# Patient Record
Sex: Male | Born: 1995
Health system: Southern US, Community
[De-identification: ages and names within clinical notes are randomized; demographics above are authoritative.]

## PROBLEM LIST (undated history)

## (undated) DIAGNOSIS — L309 Dermatitis, unspecified: Secondary | ICD-10-CM

## (undated) DIAGNOSIS — Z87828 Personal history of other (healed) physical injury and trauma: Secondary | ICD-10-CM

## (undated) DIAGNOSIS — H9193 Unspecified hearing loss, bilateral: Secondary | ICD-10-CM

## (undated) HISTORY — DX: Dermatitis, unspecified: L30.9

## (undated) HISTORY — PX: EXTERNAL EAR SURGERY: SHX627

## (undated) HISTORY — PX: TESTICLE SURGERY: SHX794

## (undated) HISTORY — DX: Personal history of other (healed) physical injury and trauma: Z87.828

## (undated) HISTORY — DX: Unspecified hearing loss, bilateral: H91.93

---
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
## 2015-03-25 ENCOUNTER — Telehealth: Payer: Self-pay | Admitting: General Practice

## 2015-03-25 NOTE — Telephone Encounter (Signed)
Yes,  please schedule at the pt  convenience

## 2015-03-25 NOTE — Telephone Encounter (Signed)
Relation to JY:NWGN Call back number:[phone]   Reason for call:  Gary Dillon I patient of Dr. Drue Novel is referring his son to establish care. Do you approve please advise

## 2015-03-26 NOTE — Telephone Encounter (Signed)
Patient scheduled for 07/23/2015

## 2015-05-28 DIAGNOSIS — Z0271 Encounter for disability determination: Secondary | ICD-10-CM

## 2015-07-22 ENCOUNTER — Telehealth: Payer: Self-pay | Admitting: *Deleted

## 2015-07-22 ENCOUNTER — Encounter: Payer: Self-pay | Admitting: *Deleted

## 2015-07-22 NOTE — Telephone Encounter (Signed)
Unable to reach patient at time of pre-visit call. Left message for patient to return call when available.  

## 2015-07-22 NOTE — Telephone Encounter (Signed)
Pt returned your call.    CB: [phone]

## 2015-07-22 NOTE — Telephone Encounter (Signed)
Pre-Visit Call completed with patient and chart updated.   Pre-Visit Info documented in Specialty Comments under Snap[employer].    

## 2015-07-23 ENCOUNTER — Ambulatory Visit (INDEPENDENT_AMBULATORY_CARE_PROVIDER_SITE_OTHER): Payer: 59 | Admitting: Internal Medicine

## 2015-07-23 ENCOUNTER — Encounter: Payer: Self-pay | Admitting: Internal Medicine

## 2015-07-23 VITALS — BP 122/74 | HR 68 | Temp 97.4°F | Ht 76.0 in | Wt 166.2 lb

## 2015-07-23 DIAGNOSIS — Z0000 Encounter for general adult medical examination without abnormal findings: Secondary | ICD-10-CM
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
## 2015-07-23 NOTE — Assessment & Plan Note (Signed)
Up-to-date on immunizations including a recent flu shot.  Labs: CMP, total cholesterol, CBC, TSH Patient education: Diet, exercise, STE

## 2015-07-23 NOTE — Progress Notes (Signed)
Pre visit review using our clinic review tool, if applicable. No additional management support is needed unless otherwise documented below in the visit note. 

## 2015-07-23 NOTE — Patient Instructions (Signed)
BEFORE YOU LEAVE THE OFFICE:  GO TO THE LAB : Get the blood work    GO TO THE FRONT DESK Schedule a complete physical exam to be done in 1 year  Please be fasting       Testicular Self-Exam A self-examination of your testicles involves looking at and feeling your testicles for abnormal lumps or swelling. Several things can cause swelling, lumps, or pain in your testicles. Some of these causes are:  Injuries.  Inflammation.  Infection.  Accumulation of fluids around your testicle (hydrocele).  Twisted testicles (testicular torsion).  Testicular cancer. Self-examination of the testicles and groin areas may be advised if you are at risk for testicular cancer. Risks for testicular cancer include:  An undescended testicle (cryptorchidism).  A history of previous testicular cancer.  A family history of testicular cancer. The testicles are easiest to examine after warm baths or showers and are more difficult to examine when you are cold. This is because the muscles attached to the testicles retract and pull them up higher or into the abdomen. Follow these steps while you are standing:  Hold your penis away from your body.  Roll one testicle between your thumb and forefinger, feeling the entire testicle.  Roll the other testicle between your thumb and forefinger, feeling the entire testicle. Feel for lumps, swelling, or discomfort. A normal testicle is egg shaped and feels firm. It is smooth and not tender. The spermatic cord can be felt as a firm spaghetti-like cord at the back of your testicle. It is also important to examine the crease between the front of your leg and your abdomen. Feel for any bumps that are tender. These could be enlarged lymph nodes.    This information is not intended to replace advice given to you by your health care provider. Make sure you discuss any questions you have with your health care provider.   Document Released: 09/14/2000 Document Revised:  02/08/2013 Document Reviewed: 11/28/2012 Elsevier Interactive Patient Education [employer]

## 2015-07-24 LAB — CBC WITH DIFFERENTIAL/PLATELET
BASOS ABS: 0 10*3/uL (ref 0.0–0.1)
Basophils Relative: 0.8 % (ref 0.0–3.0)
EOS ABS: 0.5 10*3/uL (ref 0.0–0.7)
Eosinophils Relative: 8.4 % — ABNORMAL HIGH (ref 0.0–5.0)
HEMATOCRIT: 43.3 % (ref 36.0–49.0)
Hemoglobin: 14.2 g/dL (ref 12.0–16.0)
LYMPHS PCT: 33.7 % (ref 24.0–48.0)
Lymphs Abs: 1.9 10*3/uL (ref 0.7–4.0)
MCHC: 32.7 g/dL (ref 31.0–37.0)
MCV: 84.3 fl (ref 78.0–98.0)
MONOS PCT: 7.6 % (ref 3.0–12.0)
Monocytes Absolute: 0.4 10*3/uL (ref 0.1–1.0)
NEUTROS ABS: 2.8 10*3/uL (ref 1.4–7.7)
NEUTROS PCT: 49.5 % (ref 43.0–71.0)
PLATELETS: 201 10*3/uL (ref 150.0–575.0)
RBC: 5.14 Mil/uL (ref 3.80–5.70)
RDW: 13.3 % (ref 11.4–15.5)
WBC: 5.6 10*3/uL (ref 4.5–13.5)

## 2015-07-24 LAB — COMPREHENSIVE METABOLIC PANEL
ALBUMIN: 4.5 g/dL (ref 3.5–5.2)
ALK PHOS: 73 U/L (ref 52–171)
ALT: 18 U/L (ref 0–53)
AST: 17 U/L (ref 0–37)
BILIRUBIN TOTAL: 0.4 mg/dL (ref 0.2–1.2)
BUN: 16 mg/dL (ref 6–23)
CALCIUM: 9.6 mg/dL (ref 8.4–10.5)
CO2: 20 meq/L (ref 19–32)
CREATININE: 1.03 mg/dL (ref 0.40–1.50)
Chloride: 109 mEq/L (ref 96–112)
GFR: 119.53 mL/min (ref >60.00)
Glucose, Bld: 81 mg/dL (ref 70–99)
Potassium: 4.1 mEq/L (ref 3.5–5.1)
Sodium: 142 mEq/L (ref 135–145)
TOTAL PROTEIN: 7.9 g/dL (ref 6.0–8.3)

## 2015-07-24 LAB — TSH: TSH: 1.58 u[IU]/mL (ref 0.40–5.00)

## 2015-07-24 LAB — CHOLESTEROL, TOTAL: CHOLESTEROL: 157 mg/dL (ref 0–200)

## 2016-02-26 ENCOUNTER — Encounter: Payer: Self-pay | Admitting: Podiatry

## 2016-02-26 ENCOUNTER — Ambulatory Visit (INDEPENDENT_AMBULATORY_CARE_PROVIDER_SITE_OTHER): Payer: 59 | Admitting: Podiatry

## 2016-02-26 VITALS — BP 101/63 | HR 77 | Resp 12 | Ht 77.0 in

## 2016-02-26 DIAGNOSIS — M2042 Other hammer toe(s) (acquired), left foot: Secondary | ICD-10-CM | POA: Diagnosis not present

## 2016-02-26 DIAGNOSIS — L84 Corns and callosities: Secondary | ICD-10-CM | POA: Diagnosis not present

## 2016-02-26 NOTE — Progress Notes (Signed)
   Subjective:    Patient ID: Gary Dillon, male    DOB: 23-Aug-1995, 20 y.o.   MRN: [id number]  HPI    Review of Systems  All other systems reviewed and are negative.      Objective:   Physical Exam GENERAL APPEARANCE: Alert, conversant. Appropriately groomed. No acute distress.  VASCULAR: Pedal pulses are  palpable at  [facility] and PT bilateral.  Capillary refill time is immediate to all digits,  Normal temperature gradient.  Digital hair growth is present bilateral  NEUROLOGIC: sensation is normal to 5.07 monofilament at 5/5 sites bilateral.  Light touch is intact bilateral, Muscle strength normal.  MUSCULOSKELETAL: acceptable muscle strength, tone and stability bilateral.  Intrinsic muscluature intact bilateral.  Rectus appearance of foot.  Hammer toe B/L  DERMATOLOGIC: skin color, texture, and turgor are within normal limits.  No preulcerative lesions or ulcers  are seen, no interdigital maceration noted.  No open lesions present.  Digital nails are asymptomatic. No drainage noted.  Listers corn fifth toe left foot.         Assessment & Plan:  Hammer toe fifth left  Listers corn.  IE  Debridement of corn fifth toe left foot.  RTC prn   Helane GuntherGregory Mayer DPM

## 2016-07-24 ENCOUNTER — Encounter: Payer: 59 | Admitting: Internal Medicine

## 2016-08-13 ENCOUNTER — Encounter: Payer: 59 | Admitting: Internal Medicine

## 2016-09-01 ENCOUNTER — Encounter: Payer: Self-pay | Admitting: Internal Medicine

## 2016-09-01 ENCOUNTER — Ambulatory Visit (INDEPENDENT_AMBULATORY_CARE_PROVIDER_SITE_OTHER): Payer: 59 | Admitting: Internal Medicine

## 2016-09-01 VITALS — BP 126/64 | HR 69 | Temp 98.2°F | Resp 14 | Ht 77.0 in | Wt 165.4 lb

## 2016-09-01 DIAGNOSIS — Z23 Encounter for immunization: Secondary | ICD-10-CM | POA: Diagnosis not present

## 2016-09-01 DIAGNOSIS — Z0000 Encounter for general adult medical examination without abnormal findings: Secondary | ICD-10-CM | POA: Diagnosis not present

## 2016-09-01 DIAGNOSIS — R399 Unspecified symptoms and signs involving the genitourinary system: Secondary | ICD-10-CM

## 2016-09-01 NOTE — Assessment & Plan Note (Addendum)
Td 2009; s/p Menactra 2 Discussed Bexero x 2, first dose today Labs: last year labs were wnl; check a UA and urine culture Patient education: Diet, exercise, STE

## 2016-09-01 NOTE — Progress Notes (Signed)
Pre visit review using our clinic review tool, if applicable. No additional management support is needed unless otherwise documented below in the visit note. 
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
## 2016-09-01 NOTE — Patient Instructions (Signed)
GO TO THE LAB :  Provide a  urine sample  GO TO THE FRONT DESK Schedule your next appointment for a  Physical in 1 year      Testicular Self-Exam A self-examination of your testicles (testicular self-exam) involves looking at and feeling your testicles for abnormal lumps or swelling. Several things can cause swelling, lumps, or pain in your testicles. Some of these causes are:  Injuries.  Inflammation.  Infection.  Buildup of fluids around your testicle (hydrocele).  Twisted testicles (testicular torsion).  Testicular cancer. Why is it important to do a testicular self-exam? Self-examination of the testicles and the left and right groin areas may be recommended if you are at risk for testicular cancer. Your groin is where your lower abdomen meets your upper thighs. You may be at risk for testicular cancer if you have:  An undescended testicle (cryptorchidism).  A history of previous testicular cancer.  A family history of testicular cancer. How to do a testicular self-exam The testicles are easiest to examine after a warm bath or shower. They are more difficult to examine when you are cold. This is because the muscles attached to the testicles retract and pull them up higher or into the abdomen. A normal testicle is egg-shaped and feels firm. It is smooth and not tender. The spermatic cord can be felt as a firm, spaghetti-like cord at the back of your testicle. Look and feel for changes   Stand and hold your penis away from your body.  Look at each testicle to check for lumps or swelling.  Roll each testicle between your thumb and forefinger, feeling the entire testicle. Feel for:  Lumps.  Swelling.  Discomfort.  Check the groin area between your abdomen and upper thighs on both sides of your body. Look and feel for any swelling or bumps that are tender. These could be enlarged lymph nodes. Contact a health care provider if:  You find any bumps or lumps, such as a  small, hard, pea-sized lump.  You find swelling, pain, or soreness.  You see or feel any other changes in your testicles. Summary  A self-examination of your testicles (testicular self-exam) involves looking at and feeling your testicles for any changes.  Self-examination of the testicles and the left and right groin areas may be recommended if you are at risk for testicular cancer.  You should check each of your testicles for lumps, swelling, or discomfort.  You should check for swelling or tender bumps in your groin area between your lower abdomen and upper thighs. This information is not intended to replace advice given to you by your health care provider. Make sure you discuss any questions you have with your health care provider. Document Released: 09/14/2000 Document Revised: 05/04/2016 Document Reviewed: 05/04/2016 Elsevier Interactive Patient Education  2017 [employer]

## 2016-09-02 DIAGNOSIS — Z09 Encounter for follow-up examination after completed treatment for conditions other than malignant neoplasm: Secondary | ICD-10-CM | POA: Insufficient documentation

## 2016-09-02 NOTE — Assessment & Plan Note (Signed)
Spinal cord injury: Due to stiffness, his ability to walk is limited, parking permits signed. H/o neurogenic bladder: Has not seen urology in a couple of years, used to do schedule voiding, having mild sxs lately. Will check UA urine culture. Further advice w/ result. At some point used tamsulosin.  RTC 1 year

## 2016-09-03 LAB — URINALYSIS, ROUTINE W REFLEX MICROSCOPIC
Bilirubin Urine: NEGATIVE
Hgb urine dipstick: NEGATIVE
KETONES UR: NEGATIVE
LEUKOCYTES UA: NEGATIVE
Nitrite: NEGATIVE
PH: 6 (ref 5.0–8.0)
RBC / HPF: NONE SEEN (ref 0–?)
SPECIFIC GRAVITY, URINE: 1.025 (ref 1.000–1.030)
TOTAL PROTEIN, URINE-UPE24: NEGATIVE
Urine Glucose: NEGATIVE
Urobilinogen, UA: 0.2 (ref 0.0–1.0)

## 2016-09-05 LAB — URINE CULTURE: ORGANISM ID, BACTERIA: NO GROWTH

## 2016-09-08 ENCOUNTER — Telehealth: Payer: Self-pay | Admitting: Internal Medicine

## 2016-09-08 NOTE — Telephone Encounter (Signed)
Caller name:Crtystal Schlotter Relationship to patient:Mother Can be reached:[phone] Pharmacy:  Reason for call:Requesting lab results

## 2016-09-08 NOTE — Telephone Encounter (Signed)
LMOM informing Pt's mother, Crystal, of lab results. Results placed in mail yesterday (09/07/2016).

## 2016-09-29 ENCOUNTER — Ambulatory Visit: Payer: 59

## 2016-10-07 ENCOUNTER — Ambulatory Visit (INDEPENDENT_AMBULATORY_CARE_PROVIDER_SITE_OTHER): Payer: 59 | Admitting: Behavioral Health

## 2016-10-07 ENCOUNTER — Ambulatory Visit: Payer: 59

## 2016-10-07 DIAGNOSIS — Z23 Encounter for immunization: Secondary | ICD-10-CM | POA: Diagnosis not present

## 2016-10-07 NOTE — Progress Notes (Signed)
Pre visit review using our clinic review tool, if applicable. No additional management support is needed unless otherwise documented below in the visit note.  Patient in clinic today for meningococcal vaccination (MenB). IM injection given in left deltoid. Patient tolerated injection well.

## 2017-01-14 ENCOUNTER — Telehealth: Payer: Self-pay | Admitting: *Deleted

## 2017-01-14 NOTE — Telephone Encounter (Signed)
Received request for Medical records from [facility]; forwarded to [place] for email/scan/SLS 07/26

## 2017-01-19 ENCOUNTER — Telehealth: Payer: Self-pay | Admitting: *Deleted

## 2017-01-19 NOTE — Telephone Encounter (Signed)
[facility]; forwarded to [place] for email/scan/SLS 07/31

## 2017-02-21 ENCOUNTER — Encounter: Payer: Self-pay | Admitting: Internal Medicine

## 2017-08-18 ENCOUNTER — Telehealth: Payer: Self-pay | Admitting: Internal Medicine

## 2017-08-18 NOTE — Telephone Encounter (Signed)
Copied from CRM [phone]. Topic: Quick Communication - Office Called Patient >> Aug 18, 2017  2:48 PM Valentina LucksMatos, Jackelin wrote: Reason for CRM: LVM for pt to reschedule appt for 09-07-2017 (CPE) provider not in the office in the morning.   LVM for pt to reschedule appt.

## 2017-09-07 ENCOUNTER — Encounter: Payer: 59 | Admitting: Internal Medicine

## 2017-09-13 ENCOUNTER — Encounter: Payer: Self-pay | Admitting: Internal Medicine

## 2017-09-13 ENCOUNTER — Ambulatory Visit (INDEPENDENT_AMBULATORY_CARE_PROVIDER_SITE_OTHER): Payer: 59 | Admitting: Internal Medicine

## 2017-09-13 VITALS — BP 126/66 | HR 73 | Temp 97.5°F | Resp 14 | Ht 77.0 in | Wt 178.4 lb

## 2017-09-13 DIAGNOSIS — Z114 Encounter for screening for human immunodeficiency virus [HIV]: Secondary | ICD-10-CM | POA: Diagnosis not present

## 2017-09-13 DIAGNOSIS — Z0000 Encounter for general adult medical examination without abnormal findings: Secondary | ICD-10-CM | POA: Diagnosis not present

## 2017-09-13 LAB — LIPID PANEL
CHOLESTEROL: 159 mg/dL (ref 0–200)
HDL: 47.3 mg/dL (ref >39.00)
LDL Cholesterol: 104 mg/dL — ABNORMAL HIGH (ref 0–99)
NonHDL: 111.31
Total CHOL/HDL Ratio: 3
Triglycerides: 38 mg/dL (ref 0.0–149.0)
VLDL: 7.6 mg/dL (ref 0.0–40.0)

## 2017-09-13 LAB — COMPREHENSIVE METABOLIC PANEL
ALBUMIN: 4.2 g/dL (ref 3.5–5.2)
ALK PHOS: 59 U/L (ref 39–117)
ALT: 16 U/L (ref 0–53)
AST: 16 U/L (ref 0–37)
BUN: 19 mg/dL (ref 6–23)
CO2: 28 mEq/L (ref 19–32)
Calcium: 9.4 mg/dL (ref 8.4–10.5)
Chloride: 105 mEq/L (ref 96–112)
Creatinine, Ser: 1.05 mg/dL (ref 0.40–1.50)
GFR: 114.41 mL/min (ref >60.00)
Glucose, Bld: 85 mg/dL (ref 70–99)
POTASSIUM: 4.1 meq/L (ref 3.5–5.1)
Sodium: 138 mEq/L (ref 135–145)
Total Bilirubin: 0.9 mg/dL (ref 0.2–1.2)
Total Protein: 7.7 g/dL (ref 6.0–8.3)

## 2017-09-13 LAB — CBC WITH DIFFERENTIAL/PLATELET
BASOS PCT: 0.5 % (ref 0.0–3.0)
Basophils Absolute: 0 10*3/uL (ref 0.0–0.1)
EOS PCT: 3.4 % (ref 0.0–5.0)
Eosinophils Absolute: 0.1 10*3/uL (ref 0.0–0.7)
HCT: 42.4 % (ref 39.0–52.0)
HEMOGLOBIN: 14.4 g/dL (ref 13.0–17.0)
Lymphocytes Relative: 36.7 % (ref 12.0–46.0)
Lymphs Abs: 1.2 10*3/uL (ref 0.7–4.0)
MCHC: 33.8 g/dL (ref 30.0–36.0)
MCV: 83.2 fl (ref 78.0–100.0)
MONO ABS: 0.3 10*3/uL (ref 0.1–1.0)
MONOS PCT: 8.2 % (ref 3.0–12.0)
Neutro Abs: 1.7 10*3/uL (ref 1.4–7.7)
Neutrophils Relative %: 51.2 % (ref 43.0–77.0)
Platelets: 182 10*3/uL (ref 150.0–400.0)
RBC: 5.1 Mil/uL (ref 4.22–5.81)
RDW: 13.6 % (ref 11.5–15.5)
WBC: 3.4 10*3/uL — AB (ref 4.0–10.5)

## 2017-09-13 NOTE — Patient Instructions (Signed)
GO TO THE LAB : Get the blood work     GO TO THE FRONT DESK Schedule your next appointment for a physical exam in 1 year     Safe Sex Practicing safe sex means taking steps before and during sex to reduce your risk of:  Getting an STD (sexually transmitted disease).  Giving your partner an STD.  Unwanted pregnancy.  How can I practice safe sex?  To practice safe sex:  Limit your sexual partners to only one partner who is having sex with only you.  Avoid using alcohol and recreational drugs before having sex. These substances can affect your judgment.  Before having sex with a new partner: ? Talk to your partner about past partners, past STDs, and drug use. ? You and your partner should be screened for STDs and discuss the results with each other.  Check your body regularly for sores, blisters, rashes, or unusual discharge. If you notice any of these problems, visit your health care provider.  If you have symptoms of an infection or you are being treated for an STD, avoid sexual contact.  While having sex, use a condom. Make sure to: ? Use a condom every time you have vaginal, oral, or anal sex. Both females and males should wear condoms during oral sex. ? Keep condoms in place from the beginning to the end of sexual activity. ? Use a latex condom, if possible. Latex condoms offer the best protection. ? Use only water-based lubricants or oils to lubricate a condom. Using petroleum-based lubricants or oils will weaken the condom and increase the chance that it will break.  See your health care provider for regular screenings, exams, and tests for STDs.  Talk with your health care provider about the form of birth control (contraception) that is best for you.  Get vaccinated against hepatitis B and human papillomavirus (HPV).  If you are at risk of being infected with HIV (human immunodeficiency virus), talk with your health care provider about taking a prescription medicine  to prevent HIV infection. You are considered at risk for HIV if: ? You are a man who has sex with other men. ? You are a heterosexual man or woman who is sexually active with more than one partner. ? You take drugs by injection. ? You are sexually active with a partner who has HIV.  This information is not intended to replace advice given to you by your health care provider. Make sure you discuss any questions you have with your health care provider. Document Released: 07/16/2004 Document Revised: 10/23/2015 Document Reviewed: 04/28/2015 Elsevier Interactive Patient Education  [employer]2018 Elsevier Inc.

## 2017-09-13 NOTE — Assessment & Plan Note (Signed)
Td 2009; s/p Menactra 2; s/p Bexero x 2  Labs: CMP, CBC, FLP, HIV, RPR  Reported skin problem at the penis, exam is normal.  See below. Patient education: Diet, exercise discussed.  Also safe sex.
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
## 2017-09-13 NOTE — Progress Notes (Signed)
Pre visit review using our clinic review tool, if applicable. No additional management support is needed unless otherwise documented below in the visit note. 

## 2017-09-14 LAB — RPR: RPR Ser Ql: NONREACTIVE

## 2017-09-14 LAB — HIV ANTIBODY (ROUTINE TESTING W REFLEX): HIV: NONREACTIVE

## 2017-09-14 NOTE — Assessment & Plan Note (Signed)
H/o developmental delay,s/p injury of the spinal cord: Patient seems a stable, has generalized spasticity, worse on the lower extremities.  He is a still attending college and reports he also works at [employer]. H/o neurogenic bladder: Denies any urinary symptoms H/o surgery for an descended testicle: On exam, he has 2 testicles in the scrotum, the left one is a slightly smaller. RTC 1 year

## 2018-04-15 ENCOUNTER — Telehealth: Payer: Self-pay | Admitting: *Deleted

## 2018-04-15 NOTE — Telephone Encounter (Signed)
Received [employer] [place]ibility [place] Form for transportation, completed as much as possible; forwarded to provider/SLS 10/25

## 2018-04-19 NOTE — Telephone Encounter (Signed)
Pt.'s mother calling to get update on status of [employer] form. Routed to Dr. Drue Novel to follow up.

## 2018-04-19 NOTE — Telephone Encounter (Signed)
[employer] phoned mother to notify that form was ready for pickup, mother appreciative. Copy made and sent to scanning. Original left at front end under "D" folder for pickup tomorrow by mother.

## 2018-04-19 NOTE — Telephone Encounter (Signed)
Just signed

## 2018-11-15 ENCOUNTER — Telehealth: Payer: Self-pay | Admitting: Internal Medicine

## 2018-11-15 NOTE — Telephone Encounter (Signed)
Pt's Mother, Crystal called wanting to schedule CPE for [place].  Pt has [employer].

## 2018-11-23 NOTE — Telephone Encounter (Signed)
Left msg on Mother's (Crystal) VM for her to call office back to schedule Gary Dillon's CPE now that schedules have opened up.

## 2018-11-25 NOTE — Telephone Encounter (Signed)
Patient's mother is calling to change the appointment CB- [phone]

## 2018-11-28 NOTE — Telephone Encounter (Signed)
LVM to see if he needs to change the appointment time for CPE according to incoming message.

## 2018-12-06 ENCOUNTER — Encounter: Payer: 59 | Admitting: Internal Medicine

## 2018-12-13 ENCOUNTER — Other Ambulatory Visit: Payer: Self-pay

## 2018-12-13 ENCOUNTER — Encounter: Payer: Self-pay | Admitting: Internal Medicine

## 2018-12-13 ENCOUNTER — Ambulatory Visit (INDEPENDENT_AMBULATORY_CARE_PROVIDER_SITE_OTHER): Payer: 59 | Admitting: Internal Medicine

## 2018-12-13 ENCOUNTER — Other Ambulatory Visit (HOSPITAL_COMMUNITY)
Admission: RE | Admit: 2018-12-13 | Discharge: 2018-12-13 | Disposition: A | Discharge status: Home / Self Care | Payer: 59 | Source: Ambulatory Visit | Attending: Internal Medicine | Admitting: Internal Medicine

## 2018-12-13 VITALS — BP 131/81 | HR 72 | Temp 98.0°F | Resp 16 | Ht 77.0 in | Wt 173.1 lb

## 2018-12-13 DIAGNOSIS — Z23 Encounter for immunization: Secondary | ICD-10-CM

## 2018-12-13 DIAGNOSIS — R399 Unspecified symptoms and signs involving the genitourinary system: Secondary | ICD-10-CM | POA: Insufficient documentation

## 2018-12-13 DIAGNOSIS — G9520 Unspecified cord compression: Secondary | ICD-10-CM | POA: Diagnosis not present

## 2018-12-13 DIAGNOSIS — N500 Atrophy of testis: Secondary | ICD-10-CM

## 2018-12-13 DIAGNOSIS — Z0000 Encounter for general adult medical examination without abnormal findings: Secondary | ICD-10-CM

## 2018-12-13 NOTE — Progress Notes (Signed)
Pre visit review using our clinic review tool, if applicable. No additional management support is needed unless otherwise documented below in the visit note. 

## 2018-12-13 NOTE — Assessment & Plan Note (Signed)
Td booster 11-2018; s/p Menactra 2; s/p Bexero x 2  Labs from last year reviewed, all okay. Patient education: Diet, exercise discussed.  Also safe sex.

## 2018-12-13 NOTE — Patient Instructions (Signed)
GO TO THE FRONT DESK Schedule your next appointment   for a physical exam in 1 year  We will schedule an ultrasound of your genitals   Safe Sex Practicing safe sex means taking steps before and during sex to reduce your risk of:  Getting an STD (sexually transmitted disease).  Giving your partner an STD.  Unwanted pregnancy. How can I practice safe sex? To practice safe sex:  Limit your sexual partners to only one partner who is having sex with only you.  Avoid using alcohol and recreational drugs before having sex. These substances can affect your judgment.  Before having sex with a new partner: ? Talk to your partner about past partners, past STDs, and drug use. ? You and your partner should be screened for STDs and discuss the results with each other.  Check your body regularly for sores, blisters, rashes, or unusual discharge. If you notice any of these problems, visit your health care provider.  If you have symptoms of an infection or you are being treated for an STD, avoid sexual contact.  While having sex, use a condom. Make sure to: ? Use a condom every time you have vaginal, oral, or anal sex. Both females and males should wear condoms during oral sex. ? Keep condoms in place from the beginning to the end of sexual activity. ? Use a latex condom, if possible. Latex condoms offer the best protection. ? Use only water-based lubricants or oils to lubricate a condom. Using petroleum-based lubricants or oils will weaken the condom and increase the chance that it will break.  See your health care provider for regular screenings, exams, and tests for STDs.  Talk with your health care provider about the form of birth control (contraception) that is best for you.  Get vaccinated against hepatitis B and human papillomavirus (HPV).  If you are at risk of being infected with HIV (human immunodeficiency virus), talk with your health care provider about taking a prescription  medicine to prevent HIV infection. You are considered at risk for HIV if: ? You are a man who has sex with other men. ? You are a heterosexual man or woman who is sexually active with more than one partner. ? You take drugs by injection. ? You are sexually active with a partner who has HIV. This information is not intended to replace advice given to you by your health care provider. Make sure you discuss any questions you have with your health care provider. Document Released: 07/16/2004 Document Revised: 10/23/2015 Document Reviewed: 04/28/2015 Elsevier Interactive Patient Education  2019 [employer]     Testicular Self-Exam A self-examination of your testicles (testicular self-exam) involves looking at and feeling your testicles for abnormal lumps or swelling. Several things can cause swelling, lumps, or pain in your testicles. Some of these causes are:  Injuries.  Inflammation.  Infection.  Buildup of fluids around your testicle (hydrocele).  Twisted testicles (testicular torsion).  Testicular cancer. Why is it important to do a testicular self-exam? Self-examination of the testicles and the left and right groin areas may be recommended if you are at risk for testicular cancer. Your groin is where your lower abdomen meets your upper thighs. You may be at risk for testicular cancer if you have:  An undescended testicle (cryptorchidism).  A history of previous testicular cancer.  A family history of testicular cancer. How to do a testicular self-exam The testicles are easiest to examine after a warm bath or shower. They are  more difficult to examine when you are cold. This is because the muscles attached to the testicles retract and pull them up higher or into the abdomen. A normal testicle is egg-shaped and feels firm. It is smooth and not tender. The spermatic cord can be felt as a firm, spaghetti-like cord at the back of your testicle. Look and feel for changes  Stand and  hold your penis away from your body.  Look at each testicle to check for lumps or swelling.  Roll each testicle between your thumb and forefinger, feeling the entire testicle. Feel for: ? Lumps. ? Swelling. ? Discomfort.  Check the groin area between your abdomen and upper thighs on both sides of your body. Look and feel for any swelling or bumps that are tender. These could be enlarged lymph nodes. Contact a health care provider if:  You find any bumps or lumps, such as a small, hard, pea-sized lump.  You find swelling, pain, or soreness.  You see or feel any other changes in your testicles. Summary  A self-examination of your testicles (testicular self-exam) involves looking at and feeling your testicles for any changes.  Self-examination of the testicles and the left and right groin areas may be recommended if you are at risk for testicular cancer.  You should check each of your testicles for lumps, swelling, or discomfort.  You should check for swelling or tender bumps in your groin area between your lower abdomen and upper thighs. This information is not intended to replace advice given to you by your health care provider. Make sure you discuss any questions you have with your health care provider. Document Released: 09/14/2000 Document Revised: 05/04/2016 Document Reviewed: 05/04/2016 Elsevier Interactive Patient Education  2019 [employer].
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
## 2018-12-14 NOTE — Assessment & Plan Note (Signed)
H/o developmental delay,s/p injury of the spinal cord:  Seems a stable. Neurogenic bladder: Denies any urinary symptoms at this time except sperm leakage with bowel movements.  Will check a urine for GC and trichomonas although he reports he is not sexually active. H/o surgery for an descended testicle: On exam, he has 2 testicles in the scrotum, the left one is definitely smaller, 1 cm?.  Seems smaller than last year.  Will do an ultrasound, the testicle is so small that I wonder if it is just fatty tissue. RTC 1 year

## 2018-12-15 LAB — URINE CYTOLOGY ANCILLARY ONLY
Chlamydia: NEGATIVE
Neisseria Gonorrhea: NEGATIVE
Trichomonas: NEGATIVE

## 2018-12-16 ENCOUNTER — Ambulatory Visit (HOSPITAL_BASED_OUTPATIENT_CLINIC_OR_DEPARTMENT_OTHER): Payer: 59

## 2018-12-16 ENCOUNTER — Ambulatory Visit (HOSPITAL_BASED_OUTPATIENT_CLINIC_OR_DEPARTMENT_OTHER)
Admission: RE | Admit: 2018-12-16 | Discharge: 2018-12-16 | Disposition: A | Discharge status: Home / Self Care | Payer: 59 | Source: Ambulatory Visit | Attending: Internal Medicine | Admitting: Internal Medicine

## 2018-12-16 ENCOUNTER — Other Ambulatory Visit: Payer: Self-pay

## 2018-12-16 DIAGNOSIS — N500 Atrophy of testis: Secondary | ICD-10-CM | POA: Diagnosis not present

## 2019-01-04 ENCOUNTER — Other Ambulatory Visit: Payer: Self-pay | Admitting: Internal Medicine

## 2019-01-04 DIAGNOSIS — Z20822 Contact with and (suspected) exposure to covid-19: Secondary | ICD-10-CM

## 2019-01-08 LAB — NOVEL CORONAVIRUS, NAA: SARS-CoV-2, NAA: NOT DETECTED

## 2019-03-08 ENCOUNTER — Other Ambulatory Visit: Payer: Self-pay

## 2019-03-08 DIAGNOSIS — Z20822 Contact with and (suspected) exposure to covid-19: Secondary | ICD-10-CM

## 2019-03-10 ENCOUNTER — Encounter: Payer: Self-pay | Admitting: Internal Medicine

## 2019-03-10 LAB — NOVEL CORONAVIRUS, NAA

## 2019-03-14 ENCOUNTER — Ambulatory Visit: Payer: Self-pay

## 2019-03-14 NOTE — Telephone Encounter (Signed)
Pt. Called reporting he thought his COVID 19 test was positive. Gave him Dr. Ethel Rana message, that the specimen was insufficient for testing and he would need to be retested. Verbalizes understanding.

## 2019-03-14 NOTE — Telephone Encounter (Signed)
They were unable to process the sample. Please enter new order for testing at our [facility] facility and let the patient know on

## 2019-03-15 ENCOUNTER — Telehealth: Payer: Self-pay | Admitting: *Deleted

## 2019-03-15 ENCOUNTER — Other Ambulatory Visit: Payer: Self-pay

## 2019-03-15 DIAGNOSIS — Z20822 Contact with and (suspected) exposure to covid-19: Secondary | ICD-10-CM

## 2019-03-15 NOTE — Telephone Encounter (Signed)
LMOM informing Pt to return call. Okay for [facility] to discuss.  

## 2019-03-15 NOTE — Telephone Encounter (Signed)
Pt informed by [facility] to return to testing site again for testing.

## 2019-03-15 NOTE — Telephone Encounter (Signed)
Pt returned call and message given to him to repeat the covid-19 test. Because the result was insufficient to process. He voiced understanding and has already repeated this test today.

## 2019-03-17 LAB — NOVEL CORONAVIRUS, NAA: SARS-CoV-2, NAA: NOT DETECTED

## 2019-12-22 ENCOUNTER — Ambulatory Visit (INDEPENDENT_AMBULATORY_CARE_PROVIDER_SITE_OTHER): Payer: 59 | Admitting: Internal Medicine

## 2019-12-22 ENCOUNTER — Other Ambulatory Visit: Payer: Self-pay

## 2019-12-22 ENCOUNTER — Encounter: Payer: Self-pay | Admitting: Internal Medicine

## 2019-12-22 VITALS — BP 125/78 | HR 75 | Temp 98.0°F | Resp 18 | Ht 77.0 in | Wt 171.4 lb

## 2019-12-22 DIAGNOSIS — R1084 Generalized abdominal pain: Secondary | ICD-10-CM

## 2019-12-22 DIAGNOSIS — K5900 Constipation, unspecified: Secondary | ICD-10-CM
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
## 2019-12-22 NOTE — Progress Notes (Signed)
Pre visit review using our clinic review tool, if applicable. No additional management support is needed unless otherwise documented below in the visit note. 

## 2019-12-22 NOTE — Patient Instructions (Addendum)
No more Dulcolax  Drink plenty of fluids  Follow a bland diet for few days  Once you feel better, be sure you eat plenty of fruits and vegetables  Also okay to take over-the-counter Metamucil 1 capsule daily  If you have a mild constipation, take MiraLAX 17 g 3-4 times a week.  Bring back the stool sample at your earliest convenience  If he has severe symptoms please call the office  GO TO THE FRONT DESK, PLEASE SCHEDULE YOUR APPOINTMENTS Come back for blood work next week  Come back for a physical exam in 2 months

## 2019-12-26 ENCOUNTER — Other Ambulatory Visit: Payer: Self-pay

## 2019-12-26 ENCOUNTER — Other Ambulatory Visit (INDEPENDENT_AMBULATORY_CARE_PROVIDER_SITE_OTHER): Payer: 59

## 2019-12-26 DIAGNOSIS — R1084 Generalized abdominal pain: Secondary | ICD-10-CM | POA: Diagnosis not present

## 2019-12-26 DIAGNOSIS — K5900 Constipation, unspecified: Secondary | ICD-10-CM | POA: Diagnosis not present

## 2019-12-26 LAB — CBC WITH DIFFERENTIAL/PLATELET
Basophils Absolute: 0 K/uL (ref 0.0–0.1)
Basophils Relative: 0.4 % (ref 0.0–3.0)
Eosinophils Absolute: 0.1 K/uL (ref 0.0–0.7)
Eosinophils Relative: 2.6 % (ref 0.0–5.0)
HCT: 41 % (ref 39.0–52.0)
Hemoglobin: 13.9 g/dL (ref 13.0–17.0)
Lymphocytes Relative: 33.3 % (ref 12.0–46.0)
Lymphs Abs: 1.2 K/uL (ref 0.7–4.0)
MCHC: 33.8 g/dL (ref 30.0–36.0)
MCV: 84.4 fl (ref 78.0–100.0)
Monocytes Absolute: 0.3 K/uL (ref 0.1–1.0)
Monocytes Relative: 9.5 % (ref 3.0–12.0)
Neutro Abs: 2 K/uL (ref 1.4–7.7)
Neutrophils Relative %: 54.2 % (ref 43.0–77.0)
Platelets: 174 K/uL (ref 150.0–400.0)
RBC: 4.86 Mil/uL (ref 4.22–5.81)
RDW: 13.4 % (ref 11.5–15.5)
WBC: 3.6 K/uL — ABNORMAL LOW (ref 4.0–10.5)

## 2019-12-26 LAB — COMPREHENSIVE METABOLIC PANEL
ALT: 17 U/L (ref 0–53)
AST: 16 U/L (ref 0–37)
Albumin: 4.7 g/dL (ref 3.5–5.2)
Alkaline Phosphatase: 76 U/L (ref 39–117)
BUN: 18 mg/dL (ref 6–23)
CO2: 26 mEq/L (ref 19–32)
Calcium: 9.6 mg/dL (ref 8.4–10.5)
Chloride: 105 mEq/L (ref 96–112)
Creatinine, Ser: 1.05 mg/dL (ref 0.40–1.50)
GFR: 105.44 mL/min (ref >60.00)
Glucose, Bld: 86 mg/dL (ref 70–99)
Potassium: 4.6 mEq/L (ref 3.5–5.1)
Sodium: 138 mEq/L (ref 135–145)
Total Bilirubin: 0.9 mg/dL (ref 0.2–1.2)
Total Protein: 7.3 g/dL (ref 6.0–8.3)

## 2019-12-26 LAB — TSH: TSH: 0.7 u[IU]/mL (ref 0.35–4.50)

## 2019-12-26 LAB — SEDIMENTATION RATE: Sed Rate: 5 mm/hr (ref 0–15)

## 2019-12-26 NOTE — Assessment & Plan Note (Signed)
Constipation, abdominal pain: he seems to have a pattern of a bowel movement every 2 days, recently got constipated, got Dulcolax suppository 3 days ago and since then is having 2 bowel movements daily.  he also received Imodium x1. The abdominal pain started 3 weeks ago, not clear about etiology. Plan: Avoid Dulcolax or Imodium, bland diet for the next few days, good hydration. Once he is better, recommend high-fiber diet with fruits vegetables and Metamucil to prevent constipation. Use MiraLAX 3-4 times a week as needed We will check a CMP, CBC, TSH, sed rate, Hemoccult. If abnormal labs or persistent symptoms, will refer to GI. Addendum: Patient states that he likes to have a bowel movement "every day", advised that is not at goal, is he has a BM every other day and he feels well he is fine. He seemed to understand  RTC 2 months for reassessment and CPX

## 2020-02-23 ENCOUNTER — Encounter: Payer: 59 | Admitting: Internal Medicine

## 2020-04-15 ENCOUNTER — Ambulatory Visit: Payer: 59 | Admitting: Podiatry

## 2020-04-15 ENCOUNTER — Other Ambulatory Visit: Payer: Self-pay

## 2020-04-15 DIAGNOSIS — M205X2 Other deformities of toe(s) (acquired), left foot: Secondary | ICD-10-CM | POA: Diagnosis not present

## 2020-04-15 DIAGNOSIS — L601 Onycholysis: Secondary | ICD-10-CM

## 2020-04-15 DIAGNOSIS — L84 Corns and callosities: Secondary | ICD-10-CM
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
## 2020-04-26 ENCOUNTER — Ambulatory Visit (INDEPENDENT_AMBULATORY_CARE_PROVIDER_SITE_OTHER): Payer: 59 | Admitting: Internal Medicine

## 2020-04-26 ENCOUNTER — Encounter: Payer: Self-pay | Admitting: Internal Medicine

## 2020-04-26 ENCOUNTER — Other Ambulatory Visit: Payer: Self-pay

## 2020-04-26 VITALS — BP 110/69 | HR 65 | Temp 97.7°F | Resp 16 | Ht 77.0 in | Wt 176.0 lb

## 2020-04-26 DIAGNOSIS — Z1159 Encounter for screening for other viral diseases: Secondary | ICD-10-CM | POA: Diagnosis not present

## 2020-04-26 DIAGNOSIS — Z0000 Encounter for general adult medical examination without abnormal findings: Secondary | ICD-10-CM | POA: Diagnosis not present
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
## 2020-04-26 NOTE — Patient Instructions (Signed)
   GO TO THE LAB : Get the blood work     GO TO THE FRONT DESK, PLEASE SCHEDULE YOUR APPOINTMENTS Come back for a physical exam in 1 year 

## 2020-04-26 NOTE — Progress Notes (Signed)
Pre visit review using our clinic review tool, if applicable. No additional management support is needed unless otherwise documented below in the visit note. 

## 2020-04-27 ENCOUNTER — Encounter: Payer: Self-pay | Admitting: Internal Medicine

## 2020-04-27 NOTE — Assessment & Plan Note (Signed)
Here for CPX Constipation, abdominal pain: See last visit, issue since resolved History of spinal cord injury: Seems at baseline as far as the neurological exam.  On scheduled voiding, no LUTS. RTC 1 year

## 2020-04-27 NOTE — Assessment & Plan Note (Signed)
-  Td booster 11-2018 - s/p Menactra 2; s/p Bexero x 2  - J&J covid vax 08/2019, booster recommended, he is somewhat hesitant.  Benefits discussed. -Already had a flu shot -Labs: FLP, hepatitis C - Patient education: Diet, exercise discussed.    Encourage a low-fat, high-fiber diet.  Self testicular exam reviewed.

## 2020-04-29 LAB — HEPATITIS C ANTIBODY
Hepatitis C Ab: NONREACTIVE
SIGNAL TO CUT-OFF: 0.02 (ref <1.00)

## 2020-04-29 LAB — LIPID PANEL
Cholesterol: 175 mg/dL (ref <200)
HDL: 50 mg/dL (ref >=40)
LDL Cholesterol (Calc): 113 mg/dL (calc) — ABNORMAL HIGH
Non-HDL Cholesterol (Calc): 125 mg/dL (calc) (ref <130)
Total CHOL/HDL Ratio: 3.5 (calc) (ref <5.0)
Triglycerides: 42 mg/dL (ref <150)

## 2021-04-18 IMAGING — US ULTRASOUND OF SCROTUM
2 series · 13 of 25 positions shown · non-contrast
Comparison: None

CLINICAL DATA: LEFT testicular atrophy, history of LEFT testicular
surgery for undescended testis as a child; history neurogenic
bladder, cervical spine trauma

EXAM:
SCROTAL ULTRASOUND
DOPPLER ULTRASOUND OF THE TESTICLES
TECHNIQUE: Complete ultrasound examination of the testicles, epididymis, and
other scrotal structures was performed. Color and spectral Doppler
ultrasound were also utilized to evaluate blood flow to the
testicles.

[Series 1: ultrasound of scrotum · 11 of 71 slices shown (1 of 2)]
[im 1/71]
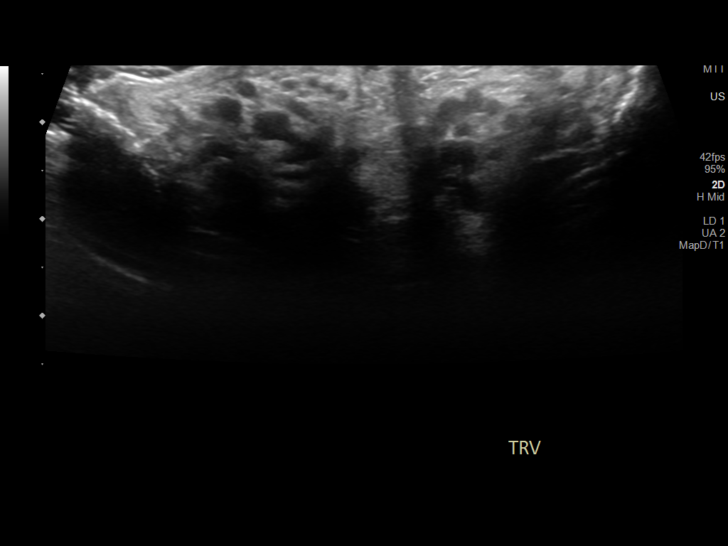
[im 7/71]
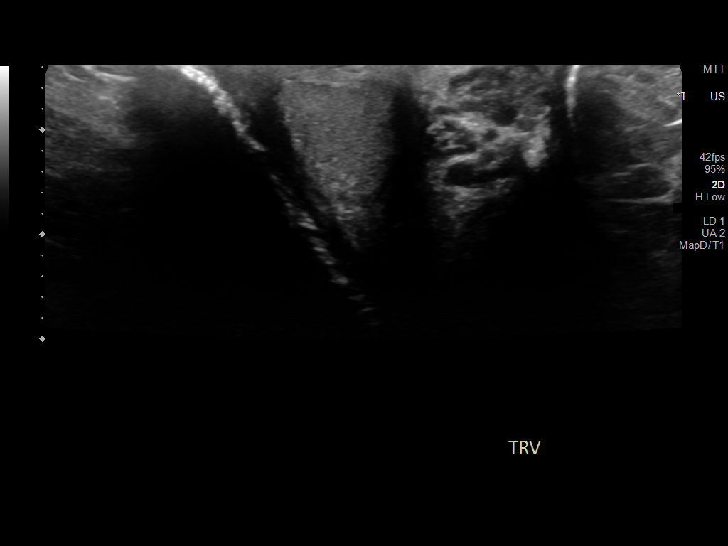
[im 14/71]
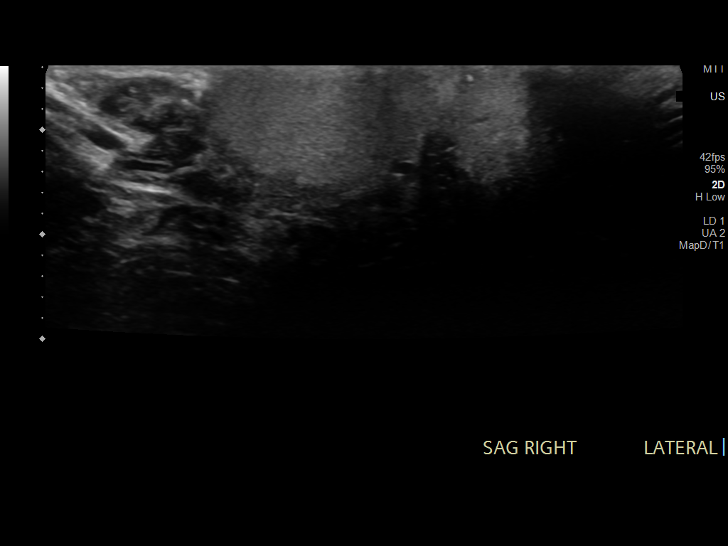
[im 21/71]
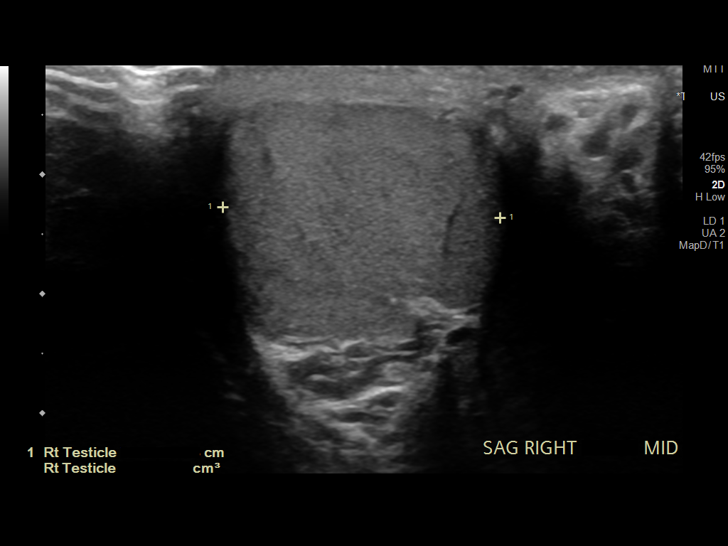
[im 27/71]
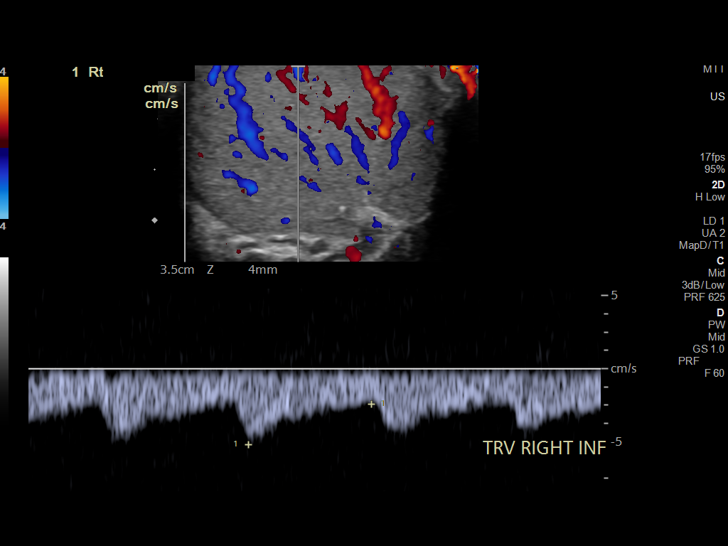
[im 34/71]
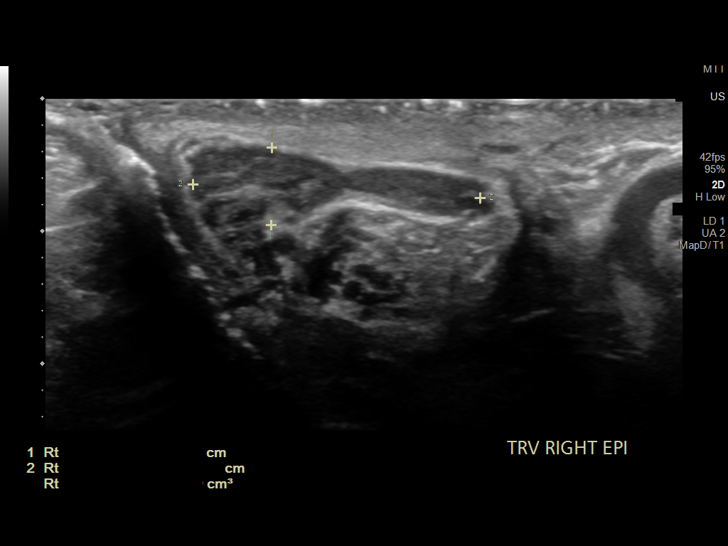
[im 41/71]
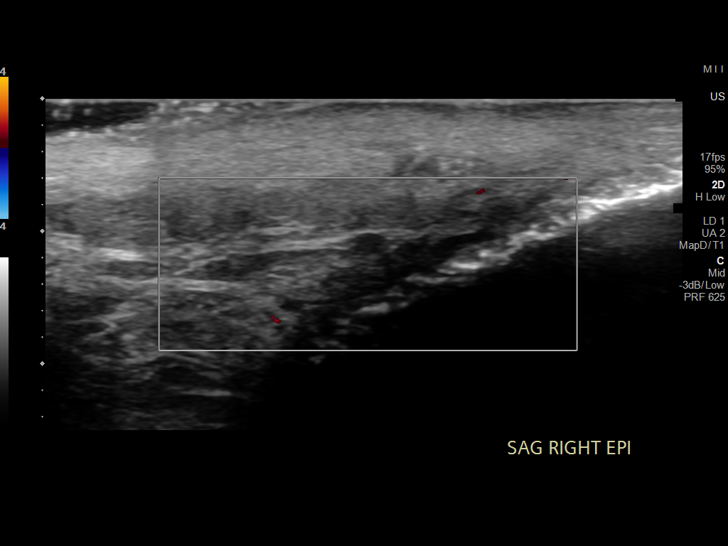
[im 47/71]
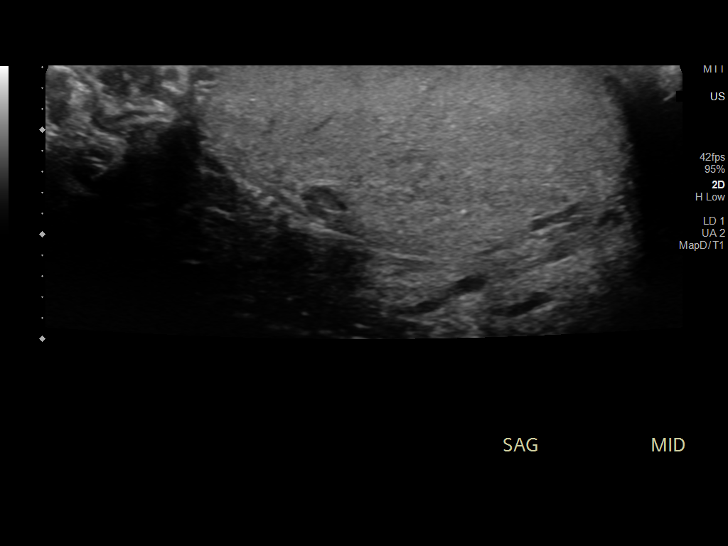
[im 54/71]
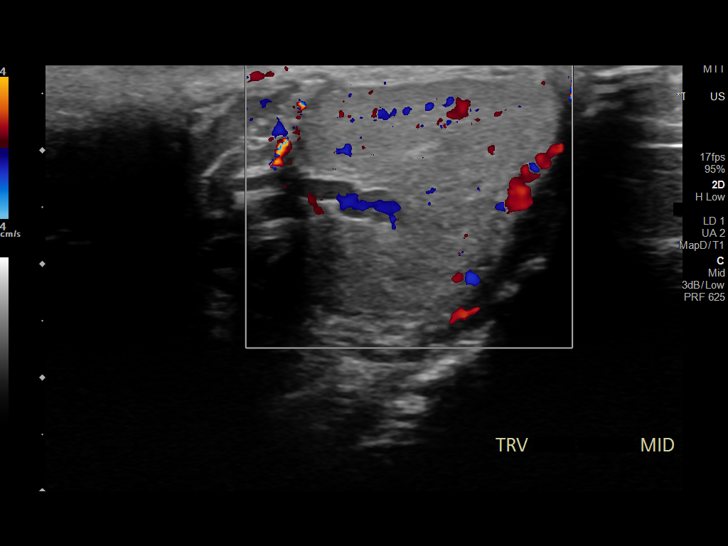
[im 61/71]
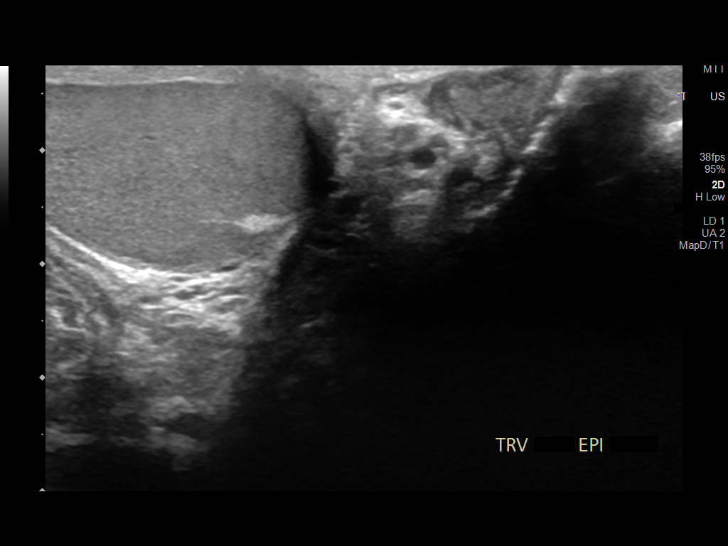
[im 67/71]
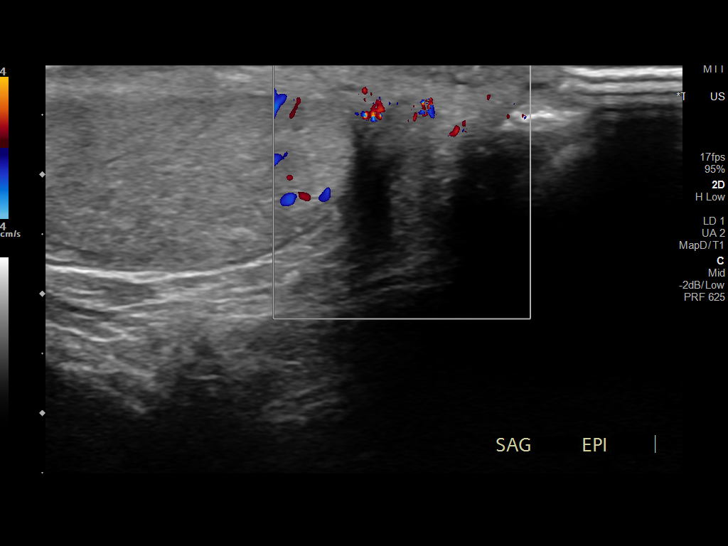

[Series 3: ultrasound of scrotum · 2 of 11 slices shown (2 of 2)]
[im 1/11]
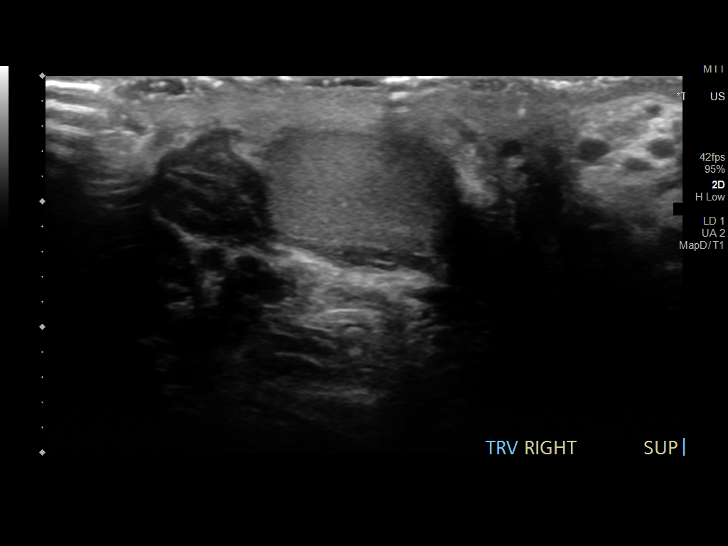
[im 11/11]
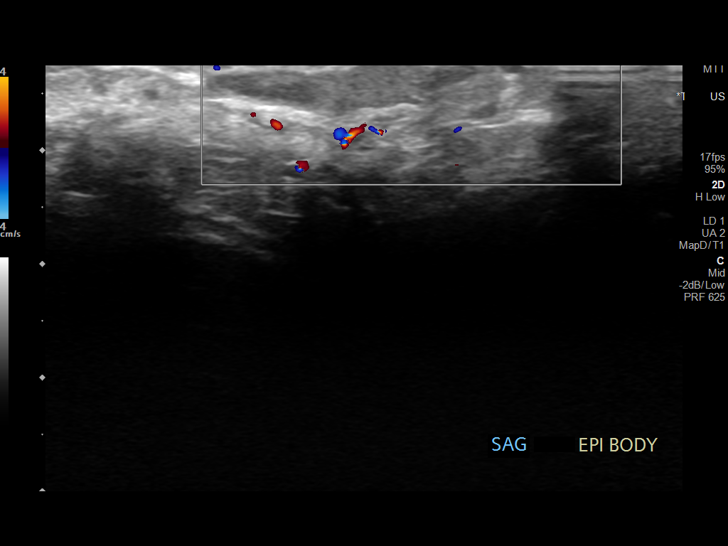

[13 of 25 positions shown; findings below may reference images not displayed]

FINDINGS: Right testicle

Measurements: 4.1 x 2.1 x 2.3 cm. Normal echogenicity without mass.
Few tiny nonspecific microcalcifications. Internal blood flow
present on color Doppler imaging.

Left testicle

Measurements: 4.2 x 2.1 x 2.2 cm. Normal echogenicity without mass.
Few tiny nonspecific microcalcifications. Internal blood flow
present on color Doppler imaging.

Right epididymis:  Normal in size and appearance.

Left epididymis:  Normal in size and appearance.

Hydrocele:  None visualized.

Varicocele:  None visualized.

Pulsed Doppler interrogation of both testes demonstrates normal low
resistance arterial and venous waveforms bilaterally.
IMPRESSION: Few tiny nonspecific microcalcifications within the testes.

Otherwise normal testicular ultrasound.

## 2021-05-02 ENCOUNTER — Ambulatory Visit (INDEPENDENT_AMBULATORY_CARE_PROVIDER_SITE_OTHER): Payer: 59 | Admitting: Internal Medicine

## 2021-05-02 ENCOUNTER — Other Ambulatory Visit: Payer: Self-pay

## 2021-05-02 ENCOUNTER — Encounter: Payer: Self-pay | Admitting: Internal Medicine

## 2021-05-02 VITALS — BP 116/70 | HR 70 | Temp 98.2°F | Resp 16 | Ht 77.0 in | Wt 166.4 lb

## 2021-05-02 DIAGNOSIS — Z0000 Encounter for general adult medical examination without abnormal findings: Secondary | ICD-10-CM | POA: Diagnosis not present

## 2021-05-02 LAB — COMPREHENSIVE METABOLIC PANEL
ALT: 21 U/L (ref 0–53)
AST: 20 U/L (ref 0–37)
Albumin: 4.7 g/dL (ref 3.5–5.2)
Alkaline Phosphatase: 70 U/L (ref 39–117)
BUN: 18 mg/dL (ref 6–23)
CO2: 28 mEq/L (ref 19–32)
Calcium: 9.6 mg/dL (ref 8.4–10.5)
Chloride: 104 mEq/L (ref 96–112)
Creatinine, Ser: 1.13 mg/dL (ref 0.40–1.50)
GFR: 90.75 mL/min (ref >60.00)
Glucose, Bld: 81 mg/dL (ref 70–99)
Potassium: 4.6 mEq/L (ref 3.5–5.1)
Sodium: 138 mEq/L (ref 135–145)
Total Bilirubin: 0.8 mg/dL (ref 0.2–1.2)
Total Protein: 7.6 g/dL (ref 6.0–8.3)

## 2021-05-02 LAB — LIPID PANEL
Cholesterol: 196 mg/dL (ref 0–200)
HDL: 59.4 mg/dL (ref >39.00)
LDL Cholesterol: 128 mg/dL — ABNORMAL HIGH (ref 0–99)
NonHDL: 136.39
Total CHOL/HDL Ratio: 3
Triglycerides: 43 mg/dL (ref 0.0–149.0)
VLDL: 8.6 mg/dL (ref 0.0–40.0)

## 2021-05-02 LAB — TSH: TSH: 1.08 u[IU]/mL (ref 0.35–5.50)
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
## 2021-05-02 NOTE — Patient Instructions (Signed)
   GO TO THE LAB : Get the blood work     GO TO THE FRONT DESK, PLEASE SCHEDULE YOUR APPOINTMENTS Come back for a physical exam in 1 year 

## 2021-05-04 ENCOUNTER — Encounter: Payer: Self-pay | Admitting: Internal Medicine

## 2021-05-04 NOTE — Assessment & Plan Note (Signed)
-  Td booster 11-2018 - s/p Menactra 2; s/p Bexero x 2  - covid vax: had a shot last week  . -Already had a flu shot -Labs:  CMP, FLP TSH - Patient education: Diet, exercise discussed.
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
## 2021-06-03 ENCOUNTER — Other Ambulatory Visit: Payer: Self-pay

## 2021-06-03 ENCOUNTER — Ambulatory Visit
Admission: EM | Admit: 2021-06-03 | Discharge: 2021-06-03 | Disposition: A | Discharge status: Home / Self Care | Payer: 59 | Attending: Internal Medicine | Admitting: Internal Medicine

## 2021-06-03 DIAGNOSIS — U071 COVID-19: Secondary | ICD-10-CM

## 2021-06-03 MED ORDER — BENZONATATE 100 MG PO CAPS: 100.0000 mg | ORAL_CAPSULE | Freq: Three times a day (TID) | ORAL | 0 refills | Status: DC | PRN

## 2021-06-03 MED ORDER — CETIRIZINE HCL 10 MG PO TABS: 10.0000 mg | ORAL_TABLET | Freq: Every day | ORAL | 0 refills | Status: DC

## 2021-06-03 MED ORDER — FLUTICASONE PROPIONATE 50 MCG/ACT NA SUSP: 1.0000 | Freq: Every day | NASAL | 0 refills | Status: DC

## 2021-06-03 NOTE — Discharge Instructions (Signed)
COVID is treated with symptomatic treatment.  You have been prescribed 3 medications to have alleviate your symptoms.  Please follow-up with primary care or urgent care if symptoms persist.

## 2021-06-03 NOTE — ED Triage Notes (Signed)
Three day h/o sore throat, HA, cough and sneezing. Has been taking tylenol with some relief. Positive at home covid test.
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
## 2021-07-03 ENCOUNTER — Ambulatory Visit: Payer: 59 | Admitting: Internal Medicine

## 2021-07-03 ENCOUNTER — Other Ambulatory Visit: Payer: Self-pay

## 2021-07-03 ENCOUNTER — Encounter: Payer: Self-pay | Admitting: Internal Medicine

## 2021-07-03 ENCOUNTER — Ambulatory Visit (HOSPITAL_BASED_OUTPATIENT_CLINIC_OR_DEPARTMENT_OTHER)
Admission: RE | Admit: 2021-07-03 | Discharge: 2021-07-03 | Disposition: A | Discharge status: Home / Self Care | Payer: 59 | Source: Ambulatory Visit | Attending: Internal Medicine | Admitting: Internal Medicine

## 2021-07-03 VITALS — BP 126/84 | HR 65 | Temp 97.7°F | Resp 16 | Ht 77.0 in | Wt 163.0 lb

## 2021-07-03 DIAGNOSIS — M25511 Pain in right shoulder: Secondary | ICD-10-CM | POA: Diagnosis present

## 2021-07-03 DIAGNOSIS — W19XXXA Unspecified fall, initial encounter: Secondary | ICD-10-CM | POA: Diagnosis not present

## 2021-07-03 DIAGNOSIS — S300XXA Contusion of lower back and pelvis, initial encounter: Secondary | ICD-10-CM
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
## 2021-07-03 NOTE — Assessment & Plan Note (Signed)
Falls, contusion right shoulder, left knee, left pelvis: Exam is benign, I do like to do R shoulder x-ray since he is tender to palpation on the posterior aspect of the joint. If that is negative I will recommend Tylenol as needed and call if no better.  Sports medicine referral?  Ortho referral?. Safety: Advised that he may need to switch positions at his job to prevent further accidents, states that his supervisor has told him he is working on it.

## 2021-07-03 NOTE — Patient Instructions (Signed)
Proceed with a x-ray  Take Tylenol as needed for pain  Call if not gradually better in the next 2 weeks

## 2021-08-12 ENCOUNTER — Ambulatory Visit: Payer: 59 | Admitting: Internal Medicine

## 2021-08-12 ENCOUNTER — Encounter: Payer: Self-pay | Admitting: Internal Medicine

## 2021-08-12 VITALS — BP 121/78 | HR 65 | Temp 97.8°F | Resp 12 | Ht 77.0 in | Wt 161.2 lb

## 2021-08-12 DIAGNOSIS — R634 Abnormal weight loss: Secondary | ICD-10-CM
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
## 2021-08-12 NOTE — Patient Instructions (Signed)
Eat at least 3 abundant nutritious (but not unhealthy) meals a day  Weight yourself twice daily and keep a log  GO TO THE LAB : Get the blood work     GO TO THE FRONT DESK, PLEASE SCHEDULE YOUR APPOINTMENTS Come back for   a checkup in 4 months

## 2021-08-13 LAB — CBC WITH DIFFERENTIAL/PLATELET
Absolute Monocytes: 320 cells/uL (ref 200–950)
Basophils Absolute: 31 cells/uL (ref 0–200)
Basophils Relative: 0.8 %
Eosinophils Absolute: 332 cells/uL (ref 15–500)
Eosinophils Relative: 8.5 %
HCT: 46.5 % (ref 38.5–50.0)
Hemoglobin: 15 g/dL (ref 13.2–17.1)
Lymphs Abs: 1408 cells/uL (ref 850–3900)
MCH: 27.9 pg (ref 27.0–33.0)
MCHC: 32.3 g/dL (ref 32.0–36.0)
MCV: 86.4 fL (ref 80.0–100.0)
MPV: 9.9 fL (ref 7.5–12.5)
Monocytes Relative: 8.2 %
Neutro Abs: 1810 cells/uL (ref 1500–7800)
Neutrophils Relative %: 46.4 %
Platelets: 206 10*3/uL (ref 140–400)
RBC: 5.38 10*6/uL (ref 4.20–5.80)
RDW: 13 % (ref 11.0–15.0)
Total Lymphocyte: 36.1 %
WBC: 3.9 10*3/uL (ref 3.8–10.8)

## 2021-08-13 LAB — URINALYSIS, ROUTINE W REFLEX MICROSCOPIC
Bilirubin Urine: NEGATIVE
Glucose, UA: NEGATIVE
Hgb urine dipstick: NEGATIVE
Ketones, ur: NEGATIVE
Leukocytes,Ua: NEGATIVE
Nitrite: NEGATIVE
Protein, ur: NEGATIVE
Specific Gravity, Urine: 1.026 (ref 1.001–1.035)
pH: 5.5 (ref 5.0–8.0)

## 2021-08-13 LAB — T4, FREE: Free T4: 1.1 ng/dL (ref 0.8–1.8)

## 2021-08-13 LAB — HEMOGLOBIN A1C
Hgb A1c MFr Bld: 5.1 % of total Hgb (ref <5.7)
Mean Plasma Glucose: 100 mg/dL
eAG (mmol/L): 5.5 mmol/L

## 2021-08-13 LAB — T3, FREE: T3, Free: 3.9 pg/mL (ref 2.3–4.2)

## 2021-08-13 LAB — HIV ANTIBODY (ROUTINE TESTING W REFLEX): HIV 1&2 Ab, 4th Generation: NONREACTIVE

## 2021-08-13 LAB — TSH: TSH: 1.06 mIU/L (ref 0.40–4.50)

## 2021-08-13 LAB — PATHOLOGIST SMEAR REVIEW

## 2021-08-14 NOTE — Assessment & Plan Note (Signed)
Weight loss: He indeed has lost some weight since November 2021 however he is weight is not too much different compared to 2017.  He has never been overweight in his lifetime. The father side of the family have typically normal to low BMIs according to the patient's mother. ROS essentially negative. Plan: To rule out potential etiologies of weight loss we will check a CBC with blood smear, A1c, UA, TSH, T3, T4, HIV. Recommend 3 healthy meals a day and keep up with his weight at home, see AVS. RTC 4 months

## 2021-08-28 DIAGNOSIS — L209 Atopic dermatitis, unspecified: Secondary | ICD-10-CM | POA: Insufficient documentation

## 2021-12-11 ENCOUNTER — Ambulatory Visit: Payer: 59 | Admitting: Internal Medicine

## 2022-02-19 ENCOUNTER — Encounter: Payer: Self-pay | Admitting: Internal Medicine

## 2022-03-20 ENCOUNTER — Telehealth: Payer: Self-pay | Admitting: Internal Medicine

## 2022-03-20 NOTE — Telephone Encounter (Signed)
Form completed, spoke w/ Crystal- she requests that we mail that back to home address. Copy of form sent for scanning.

## 2022-03-20 NOTE — Telephone Encounter (Signed)
Crystal (mother) brought in disability placard paperwork to be filled out and signed by PCP. Paperwork was placed in PCP box.

## 2022-07-10 ENCOUNTER — Other Ambulatory Visit: Payer: Self-pay | Admitting: Internal Medicine

## 2022-08-18 ENCOUNTER — Other Ambulatory Visit: Payer: Self-pay | Admitting: Internal Medicine

## 2022-09-04 ENCOUNTER — Encounter: Payer: Self-pay | Admitting: Internal Medicine

## 2022-09-04 ENCOUNTER — Ambulatory Visit (INDEPENDENT_AMBULATORY_CARE_PROVIDER_SITE_OTHER): Payer: BLUE CROSS/BLUE SHIELD | Admitting: Internal Medicine

## 2022-09-04 VITALS — BP 116/72 | HR 60 | Temp 97.7°F | Resp 16 | Ht 77.0 in | Wt 166.5 lb

## 2022-09-04 DIAGNOSIS — Z0000 Encounter for general adult medical examination without abnormal findings: Secondary | ICD-10-CM
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
## 2022-09-04 NOTE — Patient Instructions (Addendum)
Vaccines I recommend:  Covid booster     GO TO THE LAB : Get the blood work     GO TO THE FRONT DESK, PLEASE SCHEDULE YOUR APPOINTMENTS Come back for  a physical exam in 1 year 

## 2022-09-05 LAB — BASIC METABOLIC PANEL
BUN: 19 mg/dL (ref 7–25)
CO2: 24 mmol/L (ref 20–32)
Calcium: 9.5 mg/dL (ref 8.6–10.3)
Chloride: 107 mmol/L (ref 98–110)
Creat: 1.1 mg/dL (ref 0.60–1.24)
Glucose, Bld: 78 mg/dL (ref 65–99)
Potassium: 4.7 mmol/L (ref 3.5–5.3)
Sodium: 140 mmol/L (ref 135–146)

## 2022-09-05 LAB — LIPID PANEL
Cholesterol: 182 mg/dL (ref <200)
HDL: 61 mg/dL (ref >=40)
LDL Cholesterol (Calc): 110 mg/dL (calc) — ABNORMAL HIGH
Non-HDL Cholesterol (Calc): 121 mg/dL (calc) (ref <130)
Total CHOL/HDL Ratio: 3 (calc) (ref <5.0)
Triglycerides: 39 mg/dL (ref <150)

## 2022-09-06 ENCOUNTER — Encounter: Payer: Self-pay | Admitting: Internal Medicine

## 2022-09-06 NOTE — Assessment & Plan Note (Signed)
-   Td booster 11-2018 - s/p Menactra 2; s/p Bexero x 2  - covid vax: discussed  . - Has flu shots regularly -Labs: BMP FLP - Patient education: Diet, exercise discussed.

## 2022-09-06 NOTE — Assessment & Plan Note (Signed)
Here for CPX Developmental delay, spasticity due to traumatic cervical spinal cord injury: The patient reports that the spasticity is not worsening, works at [employer] and is doing well there. Has not seen neurology in years.  Will refer back to them if needed. Eczema: Well-controlled on Dupixent. Social: Lives with parents, does not drive, works at [employer]. RTC 1 year

## 2022-09-27 ENCOUNTER — Other Ambulatory Visit: Payer: Self-pay | Admitting: Internal Medicine

## 2022-10-02 ENCOUNTER — Encounter: Payer: Self-pay | Admitting: Internal Medicine

## 2022-11-11 ENCOUNTER — Ambulatory Visit
Admission: EM | Admit: 2022-11-11 | Discharge: 2022-11-11 | Disposition: A | Discharge status: Home / Self Care | Payer: BLUE CROSS/BLUE SHIELD | Attending: Internal Medicine | Admitting: Internal Medicine

## 2022-11-11 DIAGNOSIS — H6591 Unspecified nonsuppurative otitis media, right ear: Secondary | ICD-10-CM | POA: Diagnosis not present

## 2022-11-11 MED ORDER — FLUTICASONE PROPIONATE 50 MCG/ACT NA SUSP: 1.0000 | Freq: Every day | NASAL | 0 refills | Status: AC

## 2022-11-11 MED ORDER — CETIRIZINE HCL 10 MG PO TABS: 10.0000 mg | ORAL_TABLET | Freq: Every day | ORAL | 0 refills | Status: AC
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
## 2022-11-11 NOTE — Discharge Instructions (Addendum)
You have fluid behind your eardrum so I am treating you with cetirizine antihistamine and Flonase nasal spray.  Please follow-up if symptoms persist or worsen.

## 2022-11-11 NOTE — ED Triage Notes (Signed)
Pt concerned for ear infection (right) after having pain and discharge onset several days ago.

## 2022-11-12 ENCOUNTER — Encounter: Payer: Self-pay | Admitting: Internal Medicine

## 2022-11-13 ENCOUNTER — Ambulatory Visit
Admission: EM | Admit: 2022-11-13 | Discharge: 2022-11-13 | Disposition: A | Discharge status: Home / Self Care | Payer: BLUE CROSS/BLUE SHIELD | Attending: Family Medicine | Admitting: Family Medicine

## 2022-11-13 DIAGNOSIS — H65191 Other acute nonsuppurative otitis media, right ear: Secondary | ICD-10-CM

## 2022-11-13 MED ORDER — AMOXICILLIN 875 MG PO TABS: 875.0000 mg | ORAL_TABLET | Freq: Two times a day (BID) | ORAL | 0 refills | Status: AC

## 2022-11-13 NOTE — Discharge Instructions (Signed)
Continue to Flonase and cetirizine.  Take antibiotics twice daily for 10 days.

## 2022-11-13 NOTE — ED Triage Notes (Signed)
Pt states he is still having pain in right ear that started a week ago. Was seen on 11/11/22 and has been taking recommended medications but no relief of pain. Taking tylenol.
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
## 2023-04-19 ENCOUNTER — Encounter: Payer: Self-pay | Admitting: Internal Medicine

## 2023-04-29 ENCOUNTER — Encounter: Payer: Self-pay | Admitting: Internal Medicine

## 2023-09-14 ENCOUNTER — Encounter: Payer: BLUE CROSS/BLUE SHIELD | Admitting: Internal Medicine

## 2023-09-28 ENCOUNTER — Encounter: Payer: Self-pay | Admitting: Internal Medicine

## 2023-09-28 ENCOUNTER — Ambulatory Visit (INDEPENDENT_AMBULATORY_CARE_PROVIDER_SITE_OTHER): Payer: BLUE CROSS/BLUE SHIELD | Admitting: Internal Medicine

## 2023-09-28 VITALS — BP 116/80 | HR 67 | Temp 98.2°F | Resp 16 | Ht 77.0 in | Wt 177.1 lb

## 2023-09-28 DIAGNOSIS — G9520 Unspecified cord compression: Secondary | ICD-10-CM | POA: Diagnosis not present

## 2023-09-28 DIAGNOSIS — Z0000 Encounter for general adult medical examination without abnormal findings: Secondary | ICD-10-CM
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
## 2023-09-28 NOTE — Patient Instructions (Addendum)
 Vaccines I recommend: Covid booster Flu shot every fall  Please read information about safe sex and self testicular exam  GO TO THE LAB : Get the blood work     Please go to the front desk: Arrange for a physical exam in 1 year    How to Have Safe Sex Having safe sex means taking steps before and during sex to reduce your risk of: Getting a sexually transmitted infection (STI). Giving your partner an STI. Unwanted or unplanned pregnancy. How to have safe sex Ways you can have safe sex  Limit your sex partners to only one partner who is only having sex with you. Avoid using alcohol and drugs before having sex. Alcohol and drugs can affect your judgment. Before having sex with a new partner: Talk to your partner about past partners, past STIs, and drug use. Get screened for STIs and discuss the results with your partner. Ask your partner to get screened too. Check your body regularly for sores, blisters, rashes, or unusual discharge. If you notice any of these things, call your health care provider. Avoid sexual contact if you have symptoms of an infection or you're being treated for an STI. While having sex, use a condom. Make sure to: Use a condom every time you have vaginal, oral, or anal sex. Both females and males should wear condoms during oral sex. Do not use a male condom and a male condom at the same time during vaginal sex. Using both types at the same time can cause condoms to break. Keep condoms in place from the beginning to the end of sexual activity. Use a latex condom, if possible. Latex condoms offer the best protection. Use only water-based lubricants with a condom. Using petroleum-based lubricants or oils will weaken the condom and increase the chance that it will break. Ways your health care provider can help you have safe sex  See your provider for regular screenings, exams, and tests for STIs. Talk with your provider about what kind of birth control is best  for you. Get vaccinated against hepatitis B and human papillomavirus (HPV). If you're at risk of getting human immunodeficiency virus (HIV), talk with your provider about taking a medicine to prevent HIV. You're at risk for HIV if you: Are a male who has sex with other males. Are sexually active with more than one partner. Take drugs by injection. Have a sex partner who has HIV. Have unprotected sex. Have sex with someone who has sex with both males and females. Follow these instructions at home: Take your medicines only as told. Call your provider if you think you might be pregnant. Call your provider if have any symptoms of an infection. Where to find more information Centers for Disease Control and Prevention (CDC): [employer].no Office on Women's Health: [employer].ca This information is not intended to replace advice given to you by your health care provider. Make sure you discuss any questions you have with your health care provider. Document Revised: 10/28/2022 Document Reviewed: 10/28/2022 Elsevier Patient Education  2024 Elsevier Inc.    Testicular Self-Exam A self-examination of your testicles (testicular self-exam) involves looking at and feeling your testicles for abnormal lumps or swelling. Several things can cause swelling, lumps, or pain in your testicles, including: Injuries. Inflammation. Infection. Buildup of fluids around the testicle (hydrocele). Twisted testicles (testicular torsion). Testicular cancer. You may be at risk for this if you have: A testicle that has not descended. Previously had testicular cancer. A family history of testicular  cancer. General tips It is easiest to do a self-exam during or after a warm bath or shower. Testicles are harder to examine when you are cold because the muscles attached to the testicles retract and pull them up higher or into the abdomen. A normal testicle is egg-shaped and feels firm. It is smooth and not tender. It is  normal to feel a firm, spaghetti-like cord at the back of your testicle. This is the spermatic cord. Do a self-exam once a month. How to do a testicular self-exam  Stand and hold your penis away from your body. Look at each testicle to check for changes in appearance, such as swelling or changes in size or shape. Roll each testicle between your thumb and forefinger, feeling the entire testicle. Feel for: Lumps. Swelling. Discomfort. Check for swelling or tender bumps in the groin area. Your groin is where your lower belly (abdomen) meets your upper thighs. Contact a health care provider if: You find a bump or lump. This may be a small, hard bump that is the size of a pea. You have swelling, pain, or soreness in your testicle area. You see or feel any other changes in your testicles. This information is not intended to replace advice given to you by your health care provider. Make sure you discuss any questions you have with your health care provider. Document Revised: 03/23/2022 Document Reviewed: 03/23/2022 Elsevier Patient Education  2024 [employer].

## 2023-09-29 ENCOUNTER — Encounter: Payer: Self-pay | Admitting: Internal Medicine

## 2023-09-29 LAB — HEMOGLOBIN A1C: Hgb A1c MFr Bld: 5.3 % (ref 4.6–6.5)

## 2023-09-29 LAB — COMPREHENSIVE METABOLIC PANEL WITH GFR
ALT: 18 U/L (ref 0–53)
AST: 18 U/L (ref 0–37)
Albumin: 4.7 g/dL (ref 3.5–5.2)
Alkaline Phosphatase: 67 U/L (ref 39–117)
BUN: 16 mg/dL (ref 6–23)
CO2: 25 meq/L (ref 19–32)
Calcium: 9.4 mg/dL (ref 8.4–10.5)
Chloride: 103 meq/L (ref 96–112)
Creatinine, Ser: 1.19 mg/dL (ref 0.40–1.50)
GFR: 83.85 mL/min (ref >60.00)
Glucose, Bld: 81 mg/dL (ref 70–99)
Potassium: 4.4 meq/L (ref 3.5–5.1)
Sodium: 136 meq/L (ref 135–145)
Total Bilirubin: 0.8 mg/dL (ref 0.2–1.2)
Total Protein: 7.6 g/dL (ref 6.0–8.3)

## 2023-09-29 LAB — CBC WITH DIFFERENTIAL/PLATELET
Basophils Absolute: 0 10*3/uL (ref 0.0–0.1)
Basophils Relative: 0.8 % (ref 0.0–3.0)
Eosinophils Absolute: 0.4 10*3/uL (ref 0.0–0.7)
Eosinophils Relative: 8.6 % — ABNORMAL HIGH (ref 0.0–5.0)
HCT: 43.2 % (ref 39.0–52.0)
Hemoglobin: 14.2 g/dL (ref 13.0–17.0)
Lymphocytes Relative: 26.3 % (ref 12.0–46.0)
Lymphs Abs: 1.2 10*3/uL (ref 0.7–4.0)
MCHC: 32.8 g/dL (ref 30.0–36.0)
MCV: 85.8 fl (ref 78.0–100.0)
Monocytes Absolute: 0.3 10*3/uL (ref 0.1–1.0)
Monocytes Relative: 7.6 % (ref 3.0–12.0)
Neutro Abs: 2.5 10*3/uL (ref 1.4–7.7)
Neutrophils Relative %: 56.7 % (ref 43.0–77.0)
Platelets: 222 10*3/uL (ref 150.0–400.0)
RBC: 5.03 Mil/uL (ref 4.22–5.81)
RDW: 13.9 % (ref 11.5–15.5)
WBC: 4.4 10*3/uL (ref 4.0–10.5)

## 2023-09-29 NOTE — Assessment & Plan Note (Signed)
 Here for CPX - Td booster 11-2018 - Completed Menactra, Bexsero and HPV.  - Vaccines I recommend: Flu shot every fall, COVID booster -Labs: Reviewed, will get a CMP CBC A1c - Patient education: Diet, exercise discussed.  Also safe sex and self testicular exam.

## 2023-09-29 NOTE — Assessment & Plan Note (Signed)
 Here for CPX  Other issues: Eczema: Per dermatology. Developmental delay, spasticity due to traumatic cervical spinal cord injury: Seems to be doing well, denies any LUTS at this point. RTC 1 year

## 2023-11-04 IMAGING — CR DG SHOULDER 2+V*R*
3 series · 3 of 3 positions shown · non-contrast
Comparison: None

CLINICAL DATA: RIGHT shoulder pain, no known injury

EXAM:
RIGHT SHOULDER - 2+ VIEW

[w shoulder grashey right]
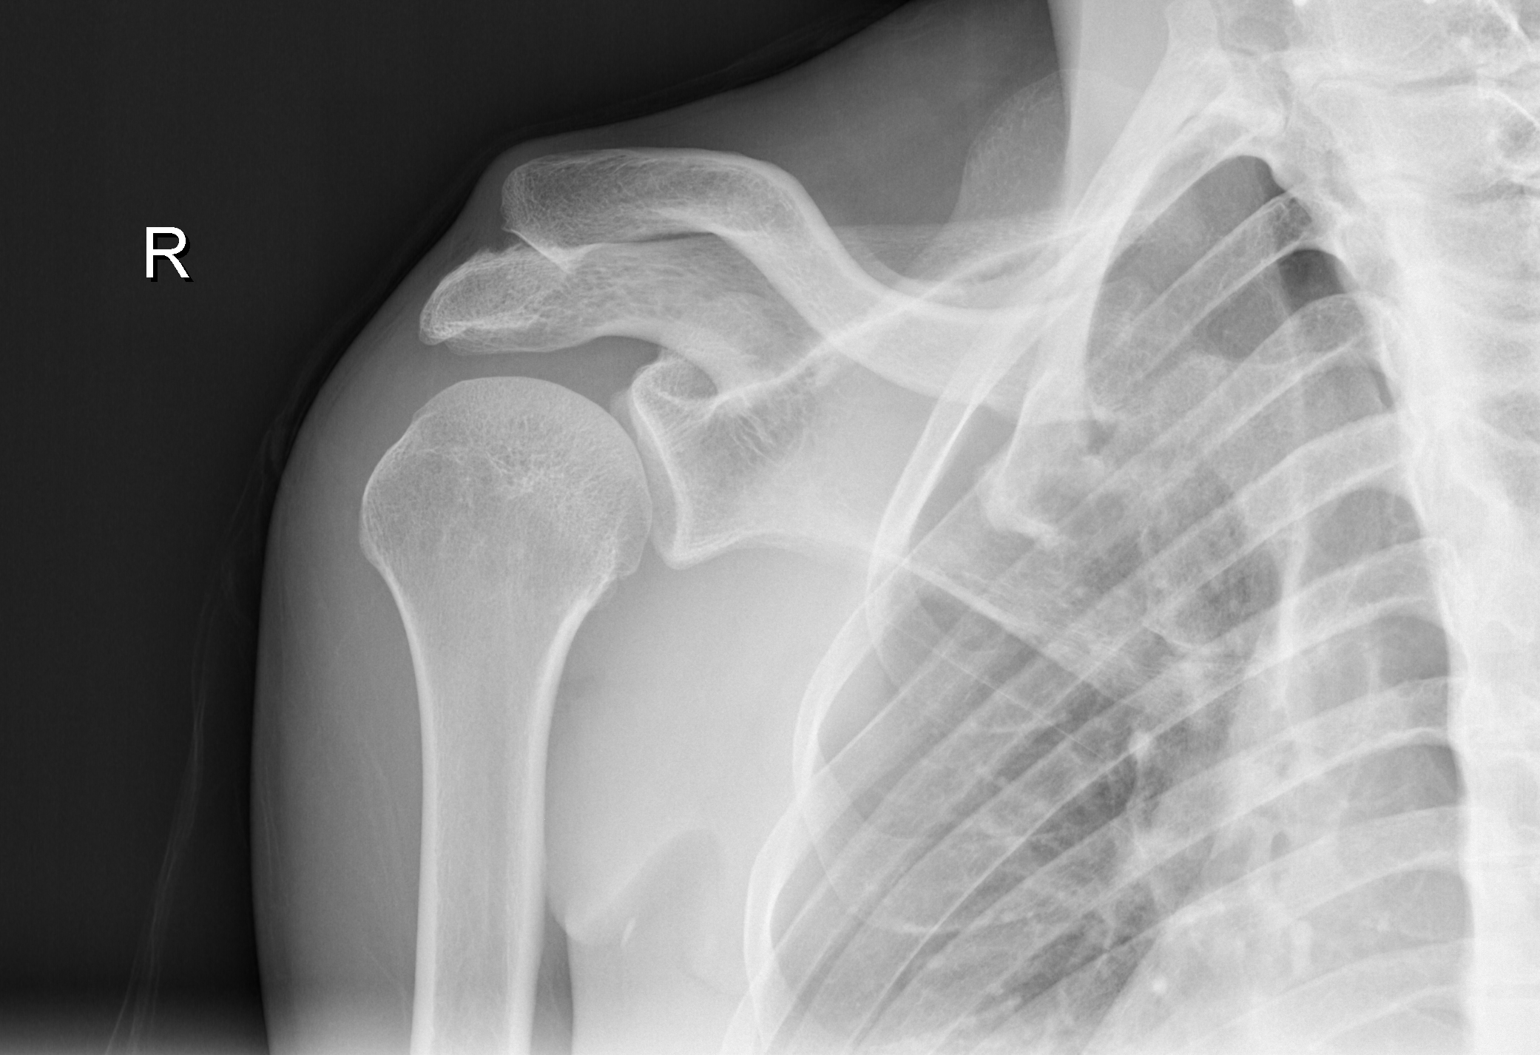

[w shoulder y view right]
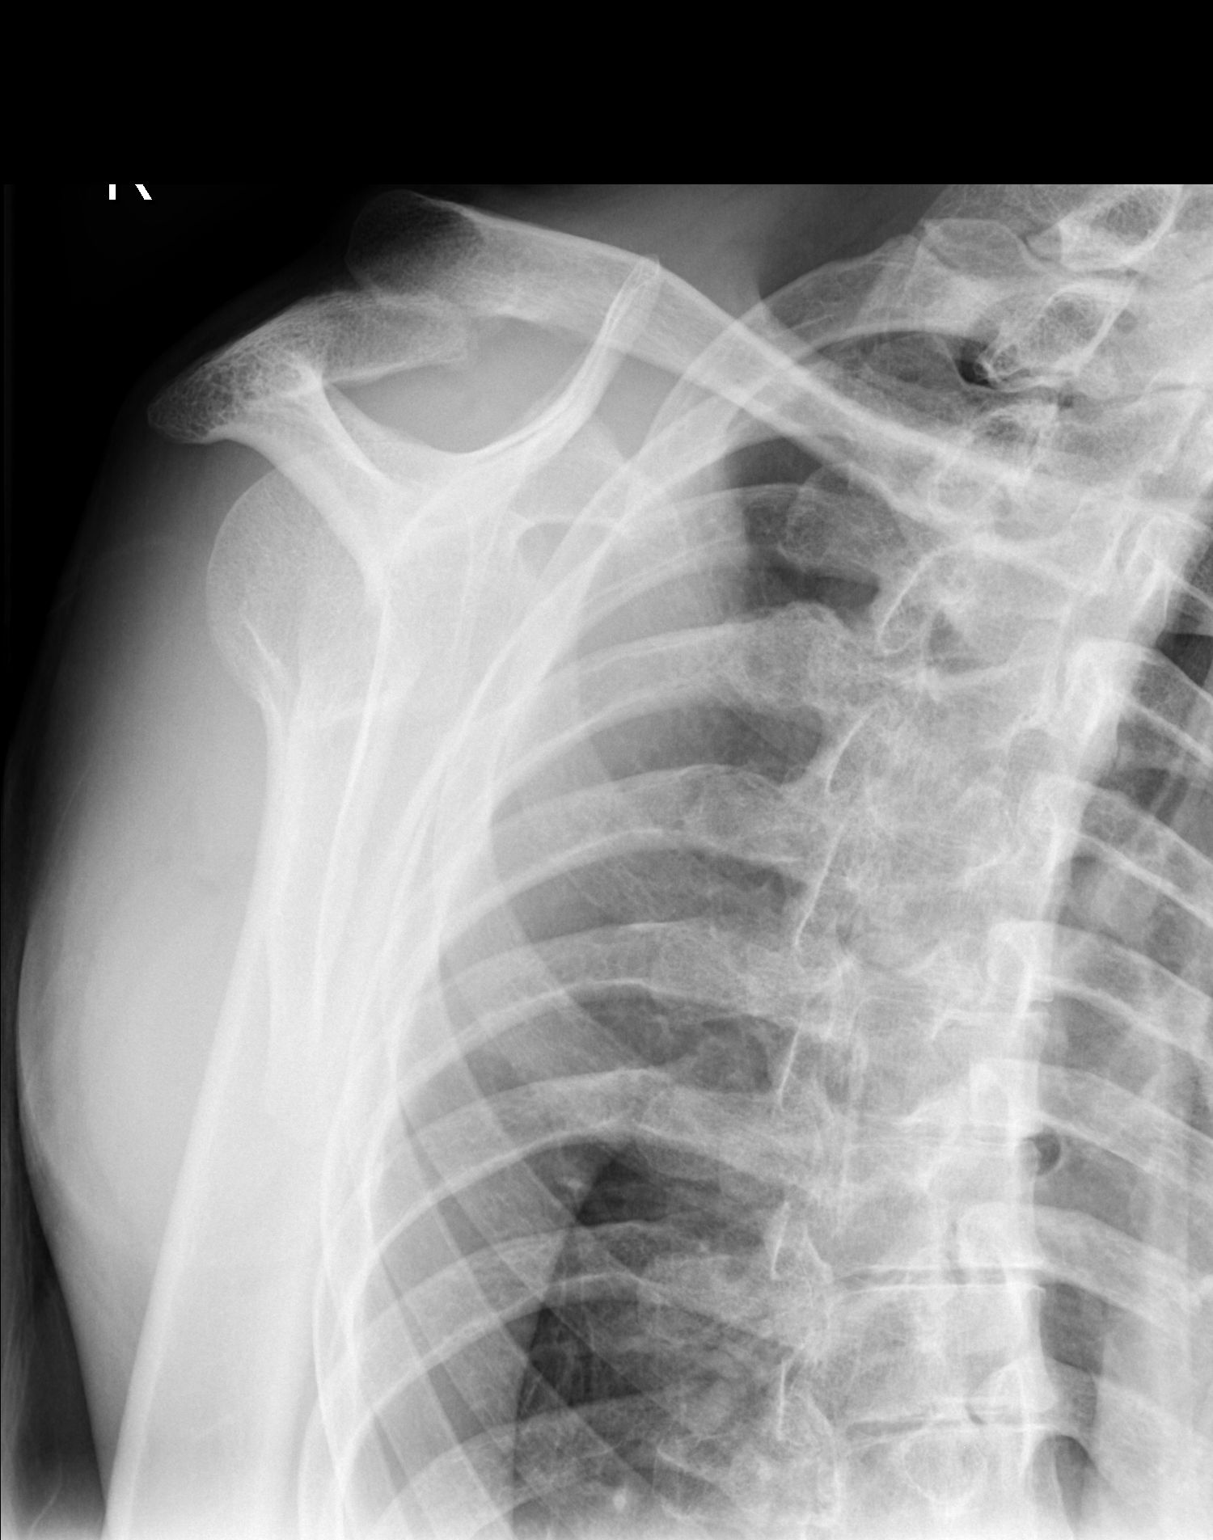

[x shoulder axillary right]
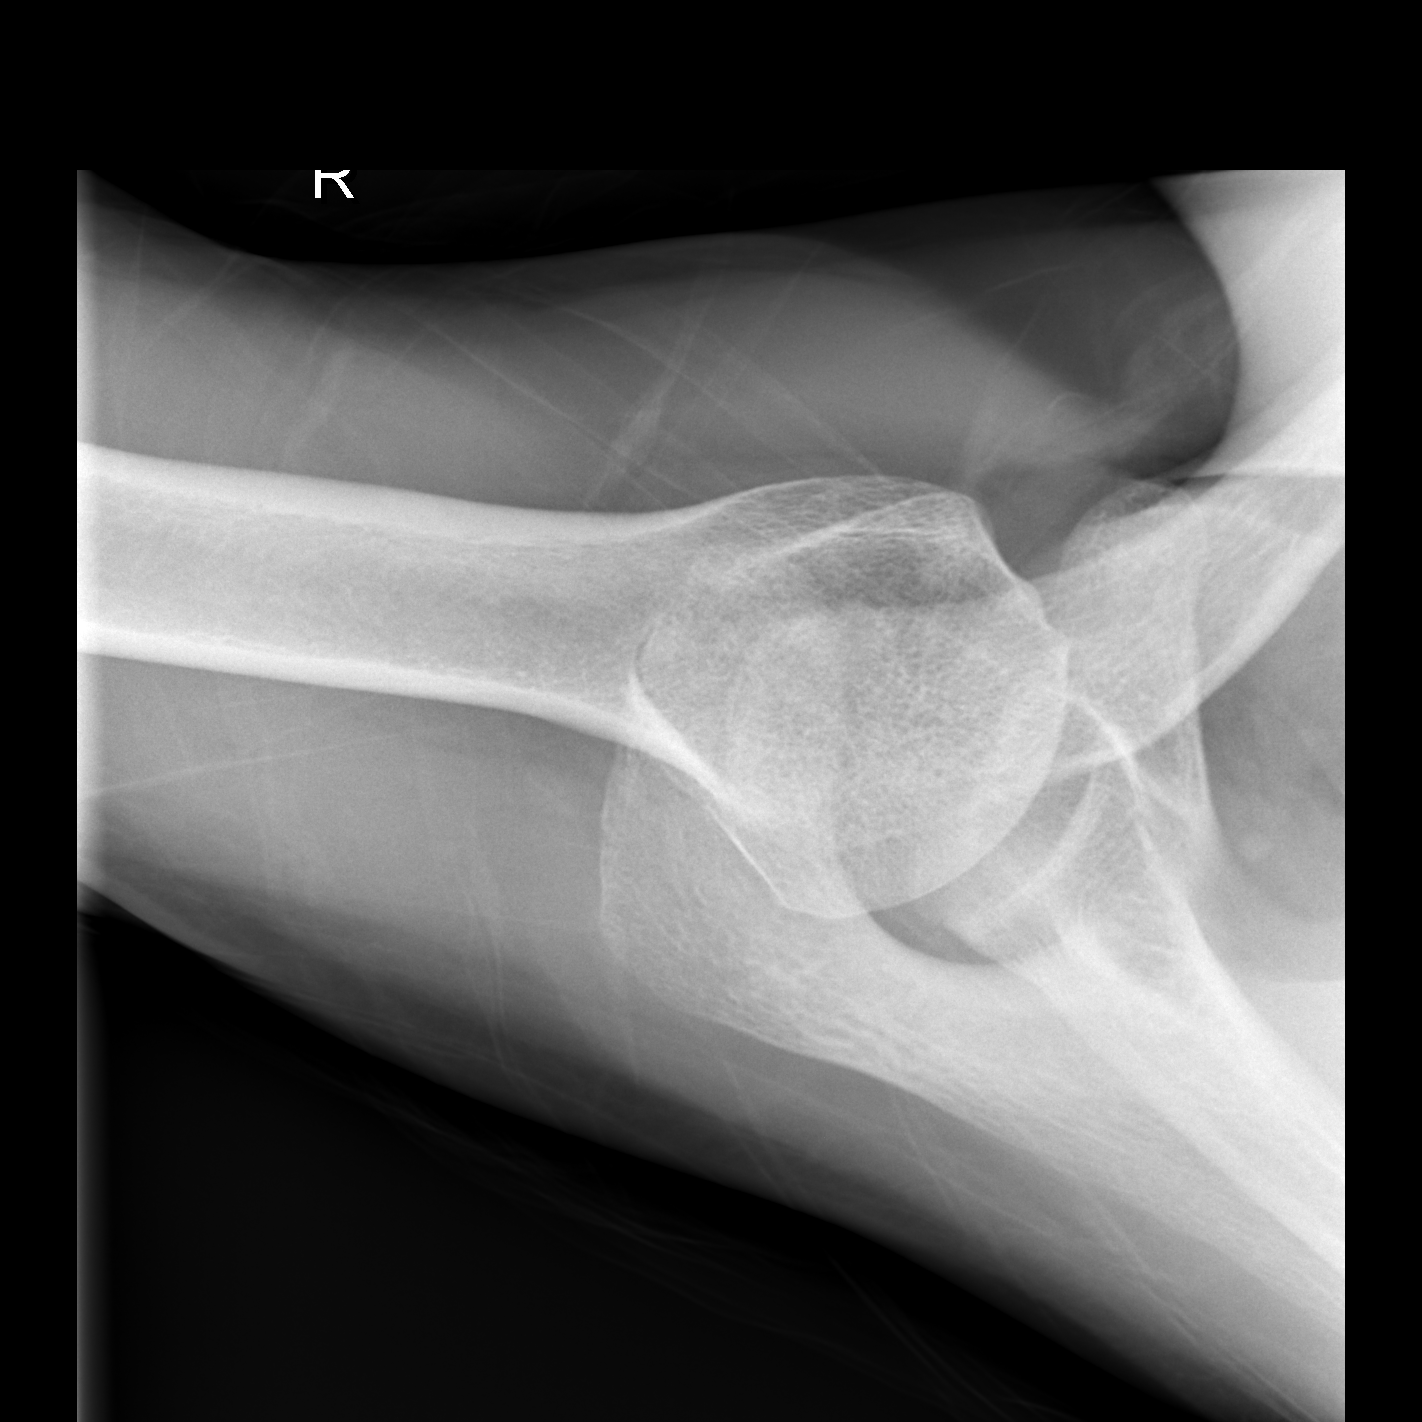

[3 of 3 positions shown; findings below may reference images not displayed]

FINDINGS: Osseous mineralization normal.

AC joint alignment normal.

Visualized ribs intact.

No acute fracture, dislocation, or bone destruction.
IMPRESSION: Normal exam.

## 2024-10-03 ENCOUNTER — Encounter: Admitting: Internal Medicine
